# Patient Record
Sex: Female | Born: 1965 | Marital: Married | Smoking: Former smoker
Health system: Southern US, Community
[De-identification: ages and names within clinical notes are randomized; demographics above are authoritative.]

---

## 2014-02-06 DIAGNOSIS — S83249A Other tear of medial meniscus, current injury, unspecified knee, initial encounter: Secondary | ICD-10-CM | POA: Insufficient documentation

## 2014-02-06 DIAGNOSIS — Z01818 Encounter for other preprocedural examination: Secondary | ICD-10-CM | POA: Insufficient documentation

## 2014-11-12 DIAGNOSIS — H35069 Retinal vasculitis, unspecified eye: Secondary | ICD-10-CM | POA: Insufficient documentation

## 2014-11-12 DIAGNOSIS — R7982 Elevated C-reactive protein (CRP): Secondary | ICD-10-CM | POA: Insufficient documentation

## 2015-01-07 DIAGNOSIS — D869 Sarcoidosis, unspecified: Secondary | ICD-10-CM | POA: Insufficient documentation

## 2015-04-24 DIAGNOSIS — R110 Nausea: Secondary | ICD-10-CM | POA: Insufficient documentation

## 2015-04-28 DIAGNOSIS — H5203 Hypermetropia, bilateral: Secondary | ICD-10-CM | POA: Insufficient documentation

## 2015-04-28 DIAGNOSIS — E7800 Pure hypercholesterolemia, unspecified: Secondary | ICD-10-CM | POA: Insufficient documentation

## 2015-04-28 DIAGNOSIS — M1990 Unspecified osteoarthritis, unspecified site: Secondary | ICD-10-CM | POA: Insufficient documentation

## 2015-04-28 DIAGNOSIS — L929 Granulomatous disorder of the skin and subcutaneous tissue, unspecified: Secondary | ICD-10-CM | POA: Insufficient documentation

## 2015-04-28 DIAGNOSIS — H44113 Panuveitis, bilateral: Secondary | ICD-10-CM | POA: Insufficient documentation

## 2015-04-28 DIAGNOSIS — I10 Essential (primary) hypertension: Secondary | ICD-10-CM | POA: Insufficient documentation

## 2015-05-20 DIAGNOSIS — R590 Localized enlarged lymph nodes: Secondary | ICD-10-CM | POA: Insufficient documentation

## 2015-07-25 DIAGNOSIS — Z1501 Genetic susceptibility to malignant neoplasm of breast: Secondary | ICD-10-CM | POA: Insufficient documentation

## 2015-07-25 DIAGNOSIS — Z1509 Genetic susceptibility to other malignant neoplasm: Secondary | ICD-10-CM

## 2015-09-01 DIAGNOSIS — Z6841 Body Mass Index (BMI) 40.0 and over, adult: Secondary | ICD-10-CM | POA: Insufficient documentation

## 2015-09-01 DIAGNOSIS — N926 Irregular menstruation, unspecified: Secondary | ICD-10-CM | POA: Insufficient documentation

## 2015-09-11 DIAGNOSIS — H469 Unspecified optic neuritis: Secondary | ICD-10-CM | POA: Insufficient documentation

## 2015-09-11 DIAGNOSIS — H35352 Cystoid macular degeneration, left eye: Secondary | ICD-10-CM | POA: Insufficient documentation

## 2015-09-16 DIAGNOSIS — Z124 Encounter for screening for malignant neoplasm of cervix: Secondary | ICD-10-CM | POA: Insufficient documentation

## 2015-09-16 DIAGNOSIS — Z1151 Encounter for screening for human papillomavirus (HPV): Secondary | ICD-10-CM | POA: Insufficient documentation

## 2015-09-24 DIAGNOSIS — R928 Other abnormal and inconclusive findings on diagnostic imaging of breast: Secondary | ICD-10-CM | POA: Insufficient documentation

## 2015-09-24 DIAGNOSIS — D8683 Sarcoid iridocyclitis: Secondary | ICD-10-CM | POA: Insufficient documentation

## 2015-10-23 DIAGNOSIS — C50912 Malignant neoplasm of unspecified site of left female breast: Secondary | ICD-10-CM | POA: Insufficient documentation

## 2015-10-30 DIAGNOSIS — H47291 Other optic atrophy, right eye: Secondary | ICD-10-CM | POA: Insufficient documentation

## 2015-11-11 DIAGNOSIS — D862 Sarcoidosis of lung with sarcoidosis of lymph nodes: Secondary | ICD-10-CM | POA: Insufficient documentation

## 2015-11-11 DIAGNOSIS — G2581 Restless legs syndrome: Secondary | ICD-10-CM | POA: Insufficient documentation

## 2015-12-30 ENCOUNTER — Ambulatory Visit (INDEPENDENT_AMBULATORY_CARE_PROVIDER_SITE_OTHER): Payer: BLUE CROSS/BLUE SHIELD | Admitting: Podiatry

## 2015-12-30 ENCOUNTER — Encounter: Payer: Self-pay | Admitting: Podiatry

## 2015-12-30 ENCOUNTER — Encounter: Payer: Self-pay | Admitting: *Deleted

## 2015-12-30 ENCOUNTER — Ambulatory Visit (INDEPENDENT_AMBULATORY_CARE_PROVIDER_SITE_OTHER): Payer: BLUE CROSS/BLUE SHIELD

## 2015-12-30 ENCOUNTER — Other Ambulatory Visit: Payer: Self-pay | Admitting: *Deleted

## 2015-12-30 DIAGNOSIS — L603 Nail dystrophy: Secondary | ICD-10-CM | POA: Diagnosis not present

## 2015-12-30 DIAGNOSIS — B353 Tinea pedis: Secondary | ICD-10-CM | POA: Diagnosis not present

## 2015-12-30 DIAGNOSIS — M7661 Achilles tendinitis, right leg: Secondary | ICD-10-CM

## 2015-12-30 DIAGNOSIS — L600 Ingrowing nail: Secondary | ICD-10-CM

## 2015-12-30 MED ORDER — NAFTIFINE HCL 1 % EX CREA: TOPICAL_CREAM | Freq: Every day | CUTANEOUS | 3 refills | Status: AC

## 2015-12-30 NOTE — Patient Instructions (Signed)
Achilles Tendinitis With Rehab Achilles tendinitis is a disorder of the Achilles tendon. The Achilles tendon connects the large calf muscles (Gastrocnemius and Soleus) to the heel bone (calcaneus). This tendon is sometimes called the heel cord. It is important for pushing-off and standing on your toes and is important for walking, running, or jumping. Tendinitis is often caused by overuse and repetitive microtrauma. SYMPTOMS  Pain, tenderness, swelling, warmth, and redness may occur over the Achilles tendon even at rest.  Pain with pushing off, or flexing or extending the ankle.  Pain that is worsened after or during activity. CAUSES   Overuse sometimes seen with rapid increase in exercise programs or in sports requiring running and jumping.  Poor physical conditioning (strength and flexibility or endurance).  Running sports, especially training running down hills.  Inadequate warm-up before practice or play or failure to stretch before participation.  Injury to the tendon. PREVENTION   Warm up and stretch before practice or competition.  Allow time for adequate rest and recovery between practices and competition.  Keep up conditioning.  Keep up ankle and leg flexibility.  Improve or keep muscle strength and endurance.  Improve cardiovascular fitness.  Use proper technique.  Use proper equipment (shoes, skates).  To help prevent recurrence, taping, protective strapping, or an adhesive bandage may be recommended for several weeks after healing is complete. PROGNOSIS   Recovery may take weeks to several months to heal.  Longer recovery is expected if symptoms have been prolonged.  Recovery is usually quicker if the inflammation is due to a direct blow as compared with overuse or sudden strain. RELATED COMPLICATIONS   Healing time will be prolonged if the condition is not correctly treated. The injury must be given plenty of time to heal.  Symptoms can reoccur if  activity is resumed too soon.  Untreated, tendinitis may increase the risk of tendon rupture requiring additional time for recovery and possibly surgery. TREATMENT   The first treatment consists of rest anti-inflammatory medication, and ice to relieve the pain.  Stretching and strengthening exercises after resolution of pain will likely help reduce the risk of recurrence. Referral to a physical therapist or athletic trainer for further evaluation and treatment may be helpful.  A walking boot or cast may be recommended to rest the Achilles tendon. This can help break the cycle of inflammation and microtrauma.  Arch supports (orthotics) may be prescribed or recommended by your caregiver as an adjunct to therapy and rest.  Surgery to remove the inflamed tendon lining or degenerated tendon tissue is rarely necessary and has shown less than predictable results. MEDICATION   Nonsteroidal anti-inflammatory medications, such as aspirin and ibuprofen, may be used for pain and inflammation relief. Do not take within 7 days before surgery. Take these as directed by your caregiver. Contact your caregiver immediately if any bleeding, stomach upset, or signs of allergic reaction occur. Other minor pain relievers, such as acetaminophen, may also be used.  Pain relievers may be prescribed as necessary by your caregiver. Do not take prescription pain medication for longer than 4 to 7 days. Use only as directed and only as much as you need.  Cortisone injections are rarely indicated. Cortisone injections may weaken tendons and predispose to rupture. It is better to give the condition more time to heal than to use them. HEAT AND COLD  Cold is used to relieve pain and reduce inflammation for acute and chronic Achilles tendinitis. Cold should be applied for 10 to 15 minutes every   2 to 3 hours for inflammation and pain and immediately after any activity that aggravates your symptoms. Use ice packs or an ice  massage.  Heat may be used before performing stretching and strengthening activities prescribed by your caregiver. Use a heat pack or a warm soak. SEEK MEDICAL CARE IF:  Symptoms get worse or do not improve in 2 weeks despite treatment.  New, unexplained symptoms develop. Drugs used in treatment may produce side effects. EXERCISES RANGE OF MOTION (ROM) AND STRETCHING EXERCISES - Achilles Tendinitis  These exercises may help you when beginning to rehabilitate your injury. Your symptoms may resolve with or without further involvement from your physician, physical therapist or athletic trainer. While completing these exercises, remember:   Restoring tissue flexibility helps normal motion to return to the joints. This allows healthier, less painful movement and activity.  An effective stretch should be held for at least 30 seconds.  A stretch should never be painful. You should only feel a gentle lengthening or release in the stretched tissue. STRETCH - Gastroc, Standing   Place hands on wall.  Extend right / left leg, keeping the front knee somewhat bent.  Slightly point your toes inward on your back foot.  Keeping your right / left heel on the floor and your knee straight, shift your weight toward the wall, not allowing your back to arch.  You should feel a gentle stretch in the right / left calf. Hold this position for __________ seconds. Repeat __________ times. Complete this stretch __________ times per day. STRETCH - Soleus, Standing   Place hands on wall.  Extend right / left leg, keeping the other knee somewhat bent.  Slightly point your toes inward on your back foot.  Keep your right / left heel on the floor, bend your back knee, and slightly shift your weight over the back leg so that you feel a gentle stretch deep in your back calf.  Hold this position for __________ seconds. Repeat __________ times. Complete this stretch __________ times per day. STRETCH -  Gastrocsoleus, Standing  Note: This exercise can place a lot of stress on your foot and ankle. Please complete this exercise only if specifically instructed by your caregiver.   Place the ball of your right / left foot on a step, keeping your other foot firmly on the same step.  Hold on to the wall or a rail for balance.  Slowly lift your other foot, allowing your body weight to press your heel down over the edge of the step.  You should feel a stretch in your right / left calf.  Hold this position for __________ seconds.  Repeat this exercise with a slight bend in your knee. Repeat __________ times. Complete this stretch __________ times per day.  STRENGTHENING EXERCISES - Achilles Tendinitis These exercises may help you when beginning to rehabilitate your injury. They may resolve your symptoms with or without further involvement from your physician, physical therapist or athletic trainer. While completing these exercises, remember:   Muscles can gain both the endurance and the strength needed for everyday activities through controlled exercises.  Complete these exercises as instructed by your physician, physical therapist or athletic trainer. Progress the resistance and repetitions only as guided.  You may experience muscle soreness or fatigue, but the pain or discomfort you are trying to eliminate should never worsen during these exercises. If this pain does worsen, stop and make certain you are following the directions exactly. If the pain is still present after adjustments,   discontinue the exercise until you can discuss the trouble with your clinician. STRENGTH - Plantar-flexors   Sit with your right / left leg extended. Holding onto both ends of a rubber exercise band/tubing, loop it around the ball of your foot. Keep a slight tension in the band.  Slowly push your toes away from you, pointing them downward.  Hold this position for __________ seconds. Return slowly, controlling the  tension in the band/tubing. Repeat __________ times. Complete this exercise __________ times per day.  STRENGTH - Plantar-flexors   Stand with your feet shoulder width apart. Steady yourself with a wall or table using as little support as needed.  Keeping your weight evenly spread over the width of your feet, rise up on your toes.*  Hold this position for __________ seconds. Repeat __________ times. Complete this exercise __________ times per day.  *If this is too easy, shift your weight toward your right / left leg until you feel challenged. Ultimately, you may be asked to do this exercise with your right / left foot only. STRENGTH - Plantar-flexors, Eccentric  Note: This exercise can place a lot of stress on your foot and ankle. Please complete this exercise only if specifically instructed by your caregiver.   Place the balls of your feet on a step. With your hands, use only enough support from a wall or rail to keep your balance.  Keep your knees straight and rise up on your toes.  Slowly shift your weight entirely to your right / left toes and pick up your opposite foot. Gently and with controlled movement, lower your weight through your right / left foot so that your heel drops below the level of the step. You will feel a slight stretch in the back of your calf at the end position.  Use the healthy leg to help rise up onto the balls of both feet, then lower weight only on the right / left leg again. Build up to 15 repetitions. Then progress to 3 consecutive sets of 15 repetitions.*  After completing the above exercise, complete the same exercise with a slight knee bend (about 30 degrees). Again, build up to 15 repetitions. Then progress to 3 consecutive sets of 15 repetitions.* Perform this exercise __________ times per day.  *When you easily complete 3 sets of 15, your physician, physical therapist or athletic trainer may advise you to add resistance by wearing a backpack filled with  additional weight. STRENGTH - Plantar Flexors, Seated   Sit on a chair that allows your feet to rest flat on the ground. If necessary, sit at the edge of the chair.  Keeping your toes firmly on the ground, lift your right / left heel as far as you can without increasing any discomfort in your ankle. Repeat __________ times. Complete this exercise __________ times a day. *If instructed by your physician, physical therapist or athletic trainer, you may add ____________________ of resistance by placing a weighted object on your right / left knee.   This information is not intended to replace advice given to you by your health care provider. Make sure you discuss any questions you have with your health care provider.   Document Released: 09/23/2004 Document Revised: 03/15/2014 Document Reviewed: 06/06/2008 Elsevier Interactive Patient Education 2016 [employer] Betadine Soak Instructions  Purchase an 8 oz. bottle of BETADINE solution (Povidone)  THE DAY AFTER THE PROCEDURE  Place 1 tablespoon of betadine solution in a quart of warm tap water.  Submerge your foot or feet  with outer bandage intact for the initial soak; this will allow the bandage to become moist and wet for easy lift off.  Once you remove your bandage, continue to soak in the solution for 20 minutes.  This soak should be done twice a day.  Next, remove your foot or feet from solution, blot dry the affected area and cover.  You may use a band aid large enough to cover the area or use gauze and tape.  Apply other medications to the area as directed by the doctor such as cortisporin otic solution (ear drops) or neosporin.  IF YOUR SKIN BECOMES IRRITATED WHILE USING THESE INSTRUCTIONS, IT IS OKAY TO SWITCH TO EPSOM SALTS AND WATER OR WHITE VINEGAR AND WATER.

## 2015-12-30 NOTE — Progress Notes (Signed)
   Subjective:    Patient ID: Colleen Clayton, female    DOB: Aug 17, 1965, 50 y.o.   MRN: [id number]  HPI: She presents today with a chief complaint of pain to the posterior heel right. She states it is been burning and aching for the past few months and seems to be getting worse. She states the morning pain is consistently worse. It also hurts just to rub on the bed sheets. She has rash and plantar aspect of the bilateral foot inserts that this may be part of her sarcoidosis. She is also discerned about the discoloration of the hallux nail plate left and a very sharp incurvated nail margin to the fibular border of the hallux right. Stage I breast cancer diagnosed August of this year. She is scheduled for a bilateral mastectomy at the end of November.    Review of Systems  Constitutional: Positive for unexpected weight change.  Eyes: Positive for visual disturbance.  Respiratory: Positive for wheezing.   Cardiovascular: Positive for leg swelling.  Endocrine: Positive for heat intolerance.  Musculoskeletal: Positive for arthralgias and gait problem.  Skin: Positive for rash.  Allergic/Immunologic: Positive for environmental allergies.  Neurological: Positive for headaches.  Hematological: Bruises/bleeds easily.  All other systems reviewed and are negative.      Objective:   Physical Exam: Vital signs are stable she is alert and oriented 3. Pulses are strongly palpable. Neurologic sensorium is intact. Deep tendon reflexes intact bilateral muscle strength is normal bilateral. She has pain on palpation to the posterior aspect of the Achilles at its insertion sinus. Aspect of the calcaneus. 3 views of the radiographs of the right foot taken today in the office demonstrated retrocalcaneal spur with soft tissue increase in density of the Achilles at the superior aspect of the calcaneus. No rupture is identified and no avulsion or fractures noticeably calcaneus. She also has a dry macular rash to the  plantar aspect in a moccasin distribution bilaterally. Discoloration of the hallux nail plate left with subungual debris and thickening is indicative of nail dystrophy artery very least onychomycosis. She also had a sharp incurvated nail margin to the fibular border of the hallux right with erythema and edema along the fibular border. This is consistent with paronychia.        Assessment & Plan:  Stage I breast cancer scheduled for bilateral mastectomy. Ingrown nail paronychia fibular border hallux right. Nail dystrophy hallux left Insertional Achilles tendinitis right. Tinea pedis.  Plan: Started her on Naftin 1% cream. Injected around the tendon 2 mg dexamethasone right heel. Placed her in a [place]. Provided ice and stretching instructions. Performed a incision and drainage along the fibular border of the hallux right after local anesthesia was administered. She tolerated procedure well. She is provided with both Augmentin with instructions for care and soaking of her toe. I also took samples of the toenail today to be sent for pathologic evaluation left foot. I will follow up with her in a couple weeks.  Bilateral mastectomy and of November 2017.

## 2016-01-08 ENCOUNTER — Telehealth: Payer: Self-pay | Admitting: *Deleted

## 2016-01-08 NOTE — Telephone Encounter (Addendum)
Pt request handicap sticker. Dr. Milinda Pointer ordered 6 month handicap sticker.06/28/2016-Pt request renewal of handicap sticker. I spoke with pt and encouraged her to make an appt if she was continuing to have problem with the foot we needed to see her. Pt states she is being treated for breast cancer and has been focusing on that. I told her I could renew for 3 months, but not to let the foot problem worsen. Pt states understanding.

## 2016-01-13 ENCOUNTER — Ambulatory Visit (INDEPENDENT_AMBULATORY_CARE_PROVIDER_SITE_OTHER): Payer: BLUE CROSS/BLUE SHIELD | Admitting: Podiatry

## 2016-01-13 ENCOUNTER — Encounter: Payer: Self-pay | Admitting: Podiatry

## 2016-01-13 DIAGNOSIS — M7661 Achilles tendinitis, right leg: Secondary | ICD-10-CM

## 2016-01-13 DIAGNOSIS — L600 Ingrowing nail: Secondary | ICD-10-CM

## 2016-01-13 NOTE — Patient Instructions (Signed)

## 2016-01-13 NOTE — Progress Notes (Signed)
She presents today for follow-up of her matrixectomy fibular border hallux right. She also presents today for follow-up of her Achilles tendinitis right foot. We also took samples of her nail last time she was in December for pathologic evaluation which has not returned yet. She states that she continues to soaking the toe is doing very well. She is happy with the outcome of the rash at this point since she has been utilizing medication. She relates that the Achilles tendinitis is only 5-10% improved without the Cam Walker.  Objective: Vital signs are stable alert and oriented 3. Pulses are palpable. She has pain on palpation of the Achilles tendon of the right heel. Near resolution of the athlete's foot. She also has a well-healing surgical toe fibular border hallux right.  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 Well-healing matrixectomy fibular border hallux right. Tinea pedis bilateral resolving.  Plan: We will await the final biopsy report for the toenails. She will continue the use of the Cam Walker. Continue to soak the toe Epsom salts and warm water 1 week. Continue use of the athlete's foot cream I will follow-up with her in 2 weeks at which time we will more than likely request an MRI if she is not progressing with the Achilles tendon. **She will be undergoing a double mastectomy after Thanksgiving.

## 2016-01-27 ENCOUNTER — Encounter (INDEPENDENT_AMBULATORY_CARE_PROVIDER_SITE_OTHER): Payer: BLUE CROSS/BLUE SHIELD | Admitting: Podiatry

## 2016-01-27 NOTE — Progress Notes (Signed)
This encounter was created in error - please disregard.

## 2016-03-03 ENCOUNTER — Telehealth: Payer: Self-pay | Admitting: Podiatry

## 2016-03-03 NOTE — Telephone Encounter (Signed)
Informed pt that Dr. Milinda Pointer had reviewed the fungal culture results as + and she would need to be treated, and reiterated the date and day of her next scheduled appt and pt states the appt day is not as she would like. Transferred to schedulers.

## 2016-03-03 NOTE — Telephone Encounter (Signed)
Patient is requesting her lab results. She missed her appointment to go over them on 21 November. She is scheduled to come in on Tuesday 18 March 2016 at 9:45 am.

## 2016-03-16 ENCOUNTER — Ambulatory Visit: Payer: BLUE CROSS/BLUE SHIELD | Admitting: Podiatry

## 2016-03-23 ENCOUNTER — Ambulatory Visit: Payer: BLUE CROSS/BLUE SHIELD | Admitting: Podiatry

## 2016-03-25 ENCOUNTER — Ambulatory Visit: Payer: BLUE CROSS/BLUE SHIELD | Admitting: Podiatry

## 2016-03-29 ENCOUNTER — Encounter: Payer: Self-pay | Admitting: Podiatry

## 2016-03-29 ENCOUNTER — Ambulatory Visit (INDEPENDENT_AMBULATORY_CARE_PROVIDER_SITE_OTHER): Payer: BLUE CROSS/BLUE SHIELD | Admitting: Podiatry

## 2016-03-29 VITALS — BP 155/101 | HR 130 | Resp 16

## 2016-03-29 DIAGNOSIS — B351 Tinea unguium: Secondary | ICD-10-CM

## 2016-03-29 DIAGNOSIS — M7661 Achilles tendinitis, right leg: Secondary | ICD-10-CM

## 2016-03-29 MED ORDER — TERBINAFINE HCL 250 MG PO TABS: 250.0000 mg | ORAL_TABLET | Freq: Every day | ORAL | 0 refills | Status: DC

## 2016-03-30 NOTE — Progress Notes (Addendum)
Subjective:     Patient ID: Colleen Clayton, female   DOB: [birth year], 51 y.o.   MRN: [id number]  HPI patient presents with husband stating her right heel has been hurting her a lot and she no she needs to get something done with this but she did have a mastectomy November   Review of Systems     Objective:   Physical Exam Neurovascular status intact muscle strength adequate with discomfort in the posterior aspect right heel medial and central side with mild on the lateral it's very tender when pressed and makes it hard to walk comfortably. Patient is found to have indications of spurring and also has mild nail disease noted    Assessment:     Chronic Achilles tendinitis right with spur formation and pain that's failed to respond him mobilization heat ice therapy and previous injection treatment along with mycotic nail infection    Plan:     H&P both conditions discussed and we want to try to avoid surgery and right due to previous surgery and I have recommended her for shockwave therapy which she will have accomplished on the medial and central element of the plantar posterior Achilles tendon. I did place her on a short-term of Lamisil to try to help with her nail disease and she will have this checked against the medication she is taking currently by her physician who is treating her currently  X-ray report indicates large posterior spur right

## 2016-06-15 DIAGNOSIS — E1165 Type 2 diabetes mellitus with hyperglycemia: Secondary | ICD-10-CM | POA: Insufficient documentation

## 2016-10-26 ENCOUNTER — Ambulatory Visit (INDEPENDENT_AMBULATORY_CARE_PROVIDER_SITE_OTHER): Payer: BLUE CROSS/BLUE SHIELD | Admitting: Podiatry

## 2016-10-26 DIAGNOSIS — M7661 Achilles tendinitis, right leg: Secondary | ICD-10-CM | POA: Diagnosis not present

## 2016-10-26 NOTE — Progress Notes (Signed)
Subjective: Colleen Clayton presents the office today for concerns of recurrent pain to the back of the right heel. She states that she was having injection as well as tried changing shoes without any significant improvement. At her last appointment Dr. Paulla Dolly had discussed with her EPAT but she did not undergo this. She states that she was doing better after last appointment but the symptoms have returned. She denies any recent injury or trauma. No numbness or tingling. Denies any systemic complaints such as fevers, chills, nausea, vomiting. No acute changes since last appointment, and no other complaints at this time.   Objective: AAO x3, NAD DP/PT pulses palpable bilaterally, CRT less than 3 seconds There is tenderness the posterior aspect of the right heel along the insertion of the Achilles tendon into the calcaneus and heel spurs present. There is no overlying edema, erythema, increase in warmth. Achilles tendon appears to be intact. Thompson test is negative. There is no pain along the course the plantar fascia. No pain with lateral compression of the calcaneus. There is no other areas of tenderness identified at this time. Equinus is present.  No open lesions or pre-ulcerative lesions.  No pain with calf compression, swelling, warmth, erythema  Assessment: Insertional Achilles tendinitis with heel spur right side  Plan: -All treatment options discussed with the patient including all alternatives, risks, complications.  -At this point discussed with her physical therapy. She will go ahead and proceed with this and the prescription was provided to her for [facility]. Continue home stretching, icing at home as well. Discussed with her shoe modifications and orthotics. Offloading sleeve was dispensed.  -Follow-up in 6 weeks or sooner if needed. -Patient encouraged to call the office with any questions, concerns, change in symptoms.   Celesta Gentile, DPM

## 2016-10-26 NOTE — Patient Instructions (Signed)

## 2016-11-04 ENCOUNTER — Ambulatory Visit: Payer: BLUE CROSS/BLUE SHIELD | Admitting: Podiatry

## 2016-12-23 ENCOUNTER — Ambulatory Visit: Payer: BLUE CROSS/BLUE SHIELD | Admitting: Podiatry

## 2017-01-01 DIAGNOSIS — D696 Thrombocytopenia, unspecified: Secondary | ICD-10-CM | POA: Insufficient documentation

## 2017-02-24 ENCOUNTER — Telehealth: Payer: Self-pay | Admitting: *Deleted

## 2017-02-24 NOTE — Telephone Encounter (Signed)
Pt request handicap sticker, hers will expire prior to next appt. Left message informing pt Dr. Jacqualyn Posey would not be back in the [facility] office for 2 weeks, but she could pick up the handicap sticker in the [place] office or in [place] at the next 2 weeks.

## 2017-03-10 ENCOUNTER — Telehealth: Payer: Self-pay | Admitting: Podiatry

## 2017-03-10 NOTE — Telephone Encounter (Signed)
Pt would like to speak with you about Therapy, instead of coming in.

## 2017-03-10 NOTE — Telephone Encounter (Signed)
I called the patient to discuss treatment options. She is hesitant on PT. We discussed EPAT and would likely do this. She is going to call the office to get this scheduled. She had no further questions or concerns.

## 2017-03-15 ENCOUNTER — Ambulatory Visit: Payer: BLUE CROSS/BLUE SHIELD | Admitting: Podiatry

## 2017-05-17 ENCOUNTER — Encounter: Payer: Self-pay | Admitting: Podiatry

## 2017-05-17 ENCOUNTER — Ambulatory Visit (INDEPENDENT_AMBULATORY_CARE_PROVIDER_SITE_OTHER): Payer: PRIVATE HEALTH INSURANCE | Admitting: Podiatry

## 2017-05-17 VITALS — BP 156/99 | HR 105 | Resp 16

## 2017-05-17 DIAGNOSIS — L600 Ingrowing nail: Secondary | ICD-10-CM | POA: Diagnosis not present

## 2017-05-17 MED ORDER — CIPROFLOXACIN-HYDROCORTISONE 0.2-1 % OT SUSP: OTIC | 2 refills | Status: DC

## 2017-05-17 NOTE — Patient Instructions (Signed)

## 2017-05-17 NOTE — Progress Notes (Signed)
She presents today with a chief complaint of a painful ingrown toenail to the tibial border of the right hallux.  She states is been hurting for the past month.  Objective: Vital signs are stable she is alert and oriented x3 pulses are palpable.  Neurologic sensorium is intact.  Deep tendon reflexes are intact.  Muscle strength normal symmetrical bilateral.  Cutaneous evaluation demonstrates no cutis breakdown though she does have sharp incurvated nail margin along the tibial and fibular border of the hallux right.  This is exquisitely tender on palpation.  Assessment: Pain in limb secondary to ingrown toenails to avoid inferior wall of the hallux right.  Plan: After verbal consent local anesthetic was injected about the hallux right after sterile Betadine skin prep.  A total of 3 cc of a 50-50 mixture of Marcaine plain and lidocaine plain were injected.  She tolerated this well.  After that a chemical matrixectomy was performed along the tibial and fibular border.  She tolerated this procedure well.  Dry sterile compressive dressing was applied with Silvadene cream.  She was given both oral and written home-going instructions for the care and soaking of her toe as well as a prescription for Cipro drops.  I will follow-up with her in 1-2 weeks at which time her husband will also make an appointment.

## 2017-05-18 ENCOUNTER — Telehealth: Payer: Self-pay | Admitting: Podiatry

## 2017-05-18 NOTE — Telephone Encounter (Signed)
I saw Dr. Milinda Pointer yesterday and the antibiotic he prescribed is requiring prior authorization. I was calling to follow up on that and to see if it had been done. I really need this medication. Please call me back at [phone]. Thank you.

## 2017-05-19 ENCOUNTER — Telehealth: Payer: Self-pay | Admitting: *Deleted

## 2017-05-19 NOTE — Telephone Encounter (Signed)
Pt states her ingrown toenail procedure is still bleeding when she walks and she would like to pick up a stiff bottom shoe from the [facility] location.

## 2017-05-19 NOTE — Telephone Encounter (Signed)
I spoke with pt, she states medication would be over $500.00 even with insurance coverage. I told pt she could use neosporin if not allergic. Pt states she is allergic to Neomycin, so I told her to get Bacitracin. Pt states understanding.

## 2017-05-19 NOTE — Telephone Encounter (Signed)
I told pt that she would be able to pick up a surgical shoe in the [facility] location tomorrow before 4:00pm. Pt states understanding.

## 2017-05-20 ENCOUNTER — Encounter: Payer: Self-pay | Admitting: *Deleted

## 2017-05-20 NOTE — Telephone Encounter (Addendum)
Pt present to office for darco shoe to right foot, to decrease press on toenail procedure. Pt request note for work absence from 05/17/2017 to 05/24/2017. Pt was given the letter in office.

## 2017-05-25 ENCOUNTER — Telehealth: Payer: Self-pay | Admitting: *Deleted

## 2017-05-25 NOTE — Telephone Encounter (Signed)
Pt states the toe is red, swollen with a drainage. I told pt the area would be red, swollen, oozy and painful on and off for 4-6 weeks to varying degrees, and this the normal, but if the redness, swelling, pain increased or there was a cloudy drainage she may need to be seen or an antibiotic. Pt states it is red, swollen and has a cloudy drainage. I told pt to continue the soaks and the bacitracin ointment and I would inform Dr. Milinda Pointer. Pt states she still needs the PA for the other medication and I told her if she was using the bacitracin she did not need the drops. Pt states she did not remember being told that. I told her that was because she said she could not afford the drops even with insurance coverage. I told pt I had not received the PA request and reviewed the clinicals and pt had previously been seen at the [facility] center. I told pt to call her pharmacy with my fax [phone] and I would take care of the PA.

## 2017-05-25 NOTE — Telephone Encounter (Signed)
Pt states she is still having a problem with the toenail and would like an antibiotic and pharmacy states documentation needs to be sent to the pharmacy.

## 2017-05-26 MED ORDER — DOXYCYCLINE HYCLATE 100 MG PO CAPS: 100.0000 mg | ORAL_CAPSULE | Freq: Two times a day (BID) | ORAL | 0 refills | Status: DC

## 2017-05-26 NOTE — Telephone Encounter (Signed)
I informed pt of Dr. Stephenie Acres orders and she states she is in [place] and will call when she returns. I told pt the antibiotic was called to the [facility] in [facility] and she said that was fine.

## 2017-05-26 NOTE — Telephone Encounter (Signed)
Start her on doxycycline and I will check her next week.

## 2017-06-25 DIAGNOSIS — J181 Lobar pneumonia, unspecified organism: Secondary | ICD-10-CM

## 2017-06-25 DIAGNOSIS — J189 Pneumonia, unspecified organism: Secondary | ICD-10-CM | POA: Insufficient documentation

## 2017-06-25 DIAGNOSIS — E119 Type 2 diabetes mellitus without complications: Secondary | ICD-10-CM | POA: Insufficient documentation

## 2017-06-25 DIAGNOSIS — I10 Essential (primary) hypertension: Secondary | ICD-10-CM | POA: Insufficient documentation

## 2017-12-22 ENCOUNTER — Ambulatory Visit (INDEPENDENT_AMBULATORY_CARE_PROVIDER_SITE_OTHER): Payer: BLUE CROSS/BLUE SHIELD | Admitting: Podiatry

## 2017-12-22 ENCOUNTER — Encounter: Payer: Self-pay | Admitting: Podiatry

## 2017-12-22 DIAGNOSIS — L603 Nail dystrophy: Secondary | ICD-10-CM | POA: Diagnosis not present

## 2017-12-22 NOTE — Progress Notes (Signed)
She presents today states that her toenail is dark and she would like to treat it for fungus.  Objective: Vital signs are stable she is alert and oriented x3 toenails hallux are thick yellow dystrophic distal Onikul lysis and crumbly.  Assessment: Probable nail dystrophy cannot rule out onychomycosis.  Plan: Samples of the skin and nail taken today and sent for pathologic evaluation.

## 2018-01-06 ENCOUNTER — Encounter: Payer: Self-pay | Admitting: Podiatry

## 2018-01-19 ENCOUNTER — Ambulatory Visit: Payer: PRIVATE HEALTH INSURANCE | Admitting: Podiatry

## 2018-03-07 ENCOUNTER — Encounter: Payer: Self-pay | Admitting: Podiatry

## 2018-03-07 ENCOUNTER — Ambulatory Visit (INDEPENDENT_AMBULATORY_CARE_PROVIDER_SITE_OTHER): Payer: BLUE CROSS/BLUE SHIELD

## 2018-03-07 ENCOUNTER — Ambulatory Visit (INDEPENDENT_AMBULATORY_CARE_PROVIDER_SITE_OTHER): Payer: BLUE CROSS/BLUE SHIELD | Admitting: Podiatry

## 2018-03-07 ENCOUNTER — Other Ambulatory Visit: Payer: Self-pay | Admitting: Podiatry

## 2018-03-07 DIAGNOSIS — M7750 Other enthesopathy of unspecified foot: Secondary | ICD-10-CM

## 2018-03-07 DIAGNOSIS — M25572 Pain in left ankle and joints of left foot: Secondary | ICD-10-CM

## 2018-03-07 DIAGNOSIS — M722 Plantar fascial fibromatosis: Secondary | ICD-10-CM

## 2018-03-07 DIAGNOSIS — L603 Nail dystrophy: Secondary | ICD-10-CM

## 2018-03-07 DIAGNOSIS — M7752 Other enthesopathy of left foot: Secondary | ICD-10-CM

## 2018-03-07 MED ORDER — TERBINAFINE HCL 250 MG PO TABS: 250.0000 mg | ORAL_TABLET | Freq: Every day | ORAL | 0 refills | Status: AC

## 2018-03-07 NOTE — Progress Notes (Signed)
She presents today with her husband and son with a chief complaint of pain to the lateral aspect of the left foot and sometimes right up and here she points to the fourth fifth tarsometatarsal joint area.  States is been going on now for about 2 weeks.  He is also concerned about pain around the left heel.  She is also type II diabetic and would like to know the results of her pathology.  Objective: Vital signs are stable she is alert and oriented x3.  Pulses are palpable.  She has pain on palpation medial calcaneal tubercle of the left heel.  She has pain on palpation of the sinus tarsi and the fourth fifth tarsometatarsal joints.  Radiographs taken today do not demonstrate any type of acute osseous abnormalities other than soft tissue swelling at the plantar fascial calcaneal insertion site.  Pathology from her toenails does demonstrate onychomycosis.  Dermatophytes.  Assessment: Dermatophytes to toenails.  Pain in limb such as plantar fasciitis with lateral compensatory syndrome left foot.  Plan: At this point I injected her sinus tarsi of her left foot with Kenalog I also injected the plantar fascial both with 20 mg Kenalog 5 mg Marcaine under sterile Betadine skin prep.  Placed in plantar fascial brace instructed her upon shoe gear also started her on a Lamisil therapy 250 mg tablets #31 p.o. daily and I will follow-up with her in 1 month for evaluation of both of these.  She is supposed to have blood work done next week from her PCP I would like to follow-up with that.

## 2018-04-19 ENCOUNTER — Telehealth: Payer: Self-pay | Admitting: Podiatry

## 2018-04-19 NOTE — Telephone Encounter (Signed)
Spoke to pt and made her aware there is no guarantee of payment per insurance company but she has met her deductible and out of pocket so they should be covered @ 100%.

## 2018-04-19 NOTE — Telephone Encounter (Signed)
Left message for pt to call to discuss orthotic coverage. She has an appt with Liliane Channel 2.13.2020

## 2018-04-20 ENCOUNTER — Ambulatory Visit: Payer: PRIVATE HEALTH INSURANCE | Admitting: Podiatry

## 2018-04-20 ENCOUNTER — Ambulatory Visit (INDEPENDENT_AMBULATORY_CARE_PROVIDER_SITE_OTHER): Payer: PRIVATE HEALTH INSURANCE | Admitting: Orthotics

## 2018-04-20 DIAGNOSIS — M7752 Other enthesopathy of left foot: Secondary | ICD-10-CM

## 2018-04-20 DIAGNOSIS — M7751 Other enthesopathy of right foot: Secondary | ICD-10-CM

## 2018-04-20 DIAGNOSIS — M7661 Achilles tendinitis, right leg: Secondary | ICD-10-CM

## 2018-04-20 DIAGNOSIS — M722 Plantar fascial fibromatosis: Secondary | ICD-10-CM

## 2018-04-20 NOTE — Progress Notes (Signed)
Patient presents today with a hx of PTTD/AAF.  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  Patient has medially shifted talus/navicular.  Goal is provide longitudinal arch support and RF stability.  Plan on deep heel cup, hug arch, wide foot orthosis w/ medial flange and varus correction for RF valgus deformity.  Patient educated in the progessive nature of PTTD and financial responsibility.  

## 2018-05-18 ENCOUNTER — Other Ambulatory Visit: Payer: PRIVATE HEALTH INSURANCE | Admitting: Orthotics

## 2018-08-23 ENCOUNTER — Telehealth: Payer: Self-pay | Admitting: Podiatry

## 2018-08-23 NOTE — Telephone Encounter (Signed)
Pt left message @ 926 asking about picking up her orthotics, She was not able to because of pandemic.   Returned call and left message for pt to call me back to schedule an appt to pick them up.

## 2018-08-23 NOTE — Telephone Encounter (Signed)
Pt returned call and is scheduled for 6.23.2020. to see Liliane Channel to puo.

## 2018-08-29 ENCOUNTER — Encounter: Payer: Self-pay | Admitting: Orthotics

## 2018-08-31 ENCOUNTER — Ambulatory Visit: Payer: Self-pay | Admitting: Orthotics

## 2018-08-31 ENCOUNTER — Other Ambulatory Visit: Payer: Self-pay

## 2018-08-31 DIAGNOSIS — M722 Plantar fascial fibromatosis: Secondary | ICD-10-CM

## 2018-08-31 DIAGNOSIS — M7661 Achilles tendinitis, right leg: Secondary | ICD-10-CM

## 2018-08-31 DIAGNOSIS — M7752 Other enthesopathy of left foot: Secondary | ICD-10-CM

## 2018-08-31 NOTE — Progress Notes (Signed)
Patient came in today to p/up functional foot orthotics.   The orthotics were assessed to both fit and function.  The F/O addressed the biomechanical issues/pathologies as intended, offering good longitudinal arch support, proper offloading, and foot support. There weren't any signs of discomfort or irritation.  The F/O fit properly in footwear with minimal trimming/adjustments. 

## 2019-02-15 ENCOUNTER — Ambulatory Visit (INDEPENDENT_AMBULATORY_CARE_PROVIDER_SITE_OTHER): Payer: BLUE CROSS/BLUE SHIELD | Admitting: Podiatry

## 2019-02-15 ENCOUNTER — Telehealth: Payer: Self-pay | Admitting: *Deleted

## 2019-02-15 ENCOUNTER — Encounter: Payer: Self-pay | Admitting: Podiatry

## 2019-02-15 ENCOUNTER — Other Ambulatory Visit: Payer: Self-pay

## 2019-02-15 DIAGNOSIS — M722 Plantar fascial fibromatosis: Secondary | ICD-10-CM

## 2019-02-15 DIAGNOSIS — L603 Nail dystrophy: Secondary | ICD-10-CM | POA: Diagnosis not present

## 2019-02-15 DIAGNOSIS — M66872 Spontaneous rupture of other tendons, left ankle and foot: Secondary | ICD-10-CM

## 2019-02-15 DIAGNOSIS — M6688 Spontaneous rupture of other tendons, other: Secondary | ICD-10-CM | POA: Diagnosis not present

## 2019-02-15 DIAGNOSIS — L600 Ingrowing nail: Secondary | ICD-10-CM | POA: Diagnosis not present

## 2019-02-15 DIAGNOSIS — M7752 Other enthesopathy of left foot: Secondary | ICD-10-CM

## 2019-02-15 DIAGNOSIS — M66869 Spontaneous rupture of other tendons, unspecified lower leg: Secondary | ICD-10-CM

## 2019-02-15 MED ORDER — CIPROFLOXACIN-DEXAMETHASONE 0.3-0.1 % OT SUSP: OTIC | 2 refills | Status: AC

## 2019-02-15 NOTE — Telephone Encounter (Signed)
Orders to given to A. Prevette, CMA for pre-cert.

## 2019-02-15 NOTE — Patient Instructions (Signed)

## 2019-02-15 NOTE — Progress Notes (Signed)
She presents today after having not seen her for a year with a chief concern of pain to the ankle left.  States that it still hurts I was unable to wear those orthotics because they were just too hard and made my my feet hurt but my ankle is just killing me is really painful right here she points to the anterior medial aspect of the ankle and tibialis anterior as well as the posterior tibial tendon.  They are exquisitely tender when she moves and she says.  She also has pain in the great toe along the tibial and fibular border of the left hallux with discoloration of the toenail.  She is wondering what was wrong with this.  The contralateral foot right, demonstrates pain on ambulation along the lateral aspect but also to the medial aspect of the heel.  Denies any trauma.  ROS: Denies fever chills nausea vomiting muscle aches pains calf pain back pain chest pain shortness of breath.  Objective: Vital signs are stable alert and oriented x3.  Pulses are palpable.  She has pain on palpation of the posterior tibial tendon with inversion against resistance been exquisitely painful.  She has pain on dorsiflexion and inversion with a small palpable mass on the medial aspect of the tibialis anterior at the level of the ankle joint.  Measures this about the size of a centimeter.  She also has pain on palpation to the lateral calcaneal tubercle of the right heel.  Left great toe does demonstrate sharp incurvated nail margins with erythema and edema along the tibiofibular border of the left hallux.  There is no purulence no malodor.  Assessment: Chronic intractable left ankle pain with possible tears of the posterior tibial tendon and the tibialis anterior cannot rule out a cyst of the tibialis anterior and cannot rule out chronic capsulitis of the ankle joint.  I feel that an MRI would best benefit this.  Ingrown toenails tibiofibular border the hallux left.  Plan fasciitis of the right heel.  Plan: Discussed  etiology pathology conservative or surgical therapies.  Injected the right lateral heel today with 20 mg of Kenalog 5 mg Marcaine point of maximal tenderness.  Tolerated procedure well without complications.  Chemical matrixectomy was performed along the tibiofibular border of the hallux left today.  Tolerated procedure well without complications.  She was given both oral and written home-going structure for the care and soaking of the tenderness.  She also received a prescription for Cipro dexamethasone drops to be applied to the toenail borders.  Samples of the same toenail was taken for pathologic evaluation and we will follow-up with her once that comes in.  I also recommended that she follow-up with Liliane Channel to see about redoing her orthotics.  She had them a year but she is never worn them.  So she will follow-up with him for that.  Also we will request an MRI of the left ankle for better visualization of the tibialis and posterior tibial tendons as well as the ankle joint.  This is for surgical consideration.  We are also going to put her in a Tri-Lock brace for stability at this point and set of any injections.

## 2019-02-15 NOTE — Telephone Encounter (Signed)
-----   Message from Rip Harbour, [facility] sent at 02/15/2019  3:54 PM EST ----- Regarding: MRI MRI left ankle - evaluate posterior tibial tendon tear and tibialis anterior tendon tear left - surgical consideration   She would like this done in [place] [phone]

## 2019-02-21 ENCOUNTER — Telehealth: Payer: Self-pay | Admitting: Podiatry

## 2019-02-21 NOTE — Telephone Encounter (Signed)
Pt called to say that med that was called in for pt with insurance the med is 150.00 wanted to know if there was a different med she can get. Was seen 02/15/2019 for ingrown

## 2019-02-21 NOTE — Telephone Encounter (Signed)
I informed pt, if she could not purchase the antibiotic drops, she could use neosporin on the bandaid after the soakings.

## 2019-02-21 NOTE — Telephone Encounter (Signed)
[employer] authorization for MRI done  Approved - Confirmation [id number]  02/21/2019 - 08/2019

## 2019-02-21 NOTE — Telephone Encounter (Signed)
Faxed orders and pre-cert information from A. Prevette, CMA to [place].

## 2019-03-13 ENCOUNTER — Ambulatory Visit: Payer: BLUE CROSS/BLUE SHIELD | Admitting: Podiatry

## 2019-03-13 ENCOUNTER — Other Ambulatory Visit: Payer: BLUE CROSS/BLUE SHIELD | Admitting: Orthotics

## 2019-03-30 ENCOUNTER — Telehealth: Payer: Self-pay | Admitting: *Deleted

## 2019-03-30 NOTE — Telephone Encounter (Signed)
Faxed request for MRI disc printed in DiaCom format from the machine to be mailed to our office, from [facility] in [place] [facility]).

## 2019-03-30 NOTE — Telephone Encounter (Signed)
I informed pt Dr. Milinda Pointer had reviewed the MRI results and would like to send a copy of the MRI disc to a radiology specialist for mor details for treatment planning and there would be a 10-14 day delay in the final results and once received I would call with instructions. Pt states she had the MRI performed in [facility] and it was ordered by Dr. Milinda Pointer in 02/2019. I told pt I had a fax number for the MRI center and would fax the request directly to them for the MRI disc copy.

## 2019-03-30 NOTE — Telephone Encounter (Signed)
Dr. Milinda Pointer request copy of MRI 03/25/2019 disc for over read. I called [facility], was transferred by Gerald Stabs to Radiology and left message for the Radiology department to mail a copy of the MRI disc printed from the machine in DiaCom format to the [facility] [place]. Direct line to [place] per recording.

## 2019-04-06 NOTE — Telephone Encounter (Signed)
Called patient to schedule appt to come in for her fungal culture results and patient wanted to follow up and see if we had received her MRI disc.  Please let me know and I will call patient to schedule appt to come in for both the MRI results and Fungal results.

## 2019-04-06 NOTE — Telephone Encounter (Signed)
Colleen Clayton - [facility] states received the MRI disc today. Informed pt the MRI disc copy had been received and it may still take up to 10 days.

## 2019-04-18 ENCOUNTER — Encounter: Payer: Self-pay | Admitting: Podiatry

## 2019-04-18 ENCOUNTER — Telehealth: Payer: Self-pay | Admitting: *Deleted

## 2019-04-18 NOTE — Telephone Encounter (Signed)
-----   Message from Viviana Simpler, [facility] sent at 04/18/2019  9:26 AM EST ----- Colleen Clayton, patient called and wanted to know if the MRI disk was back from the reread and her phone number is [phone], and if the disc is then patient wants to be seen. Lattie Haw

## 2019-04-18 NOTE — Telephone Encounter (Signed)
I called [facility] and the report was ready and faxed. Dr. Milinda Pointer reviewed. He would like the patient to see Dr. Amalia Hailey for follow up of her ankle and to discuss the report further regarding treatment.

## 2019-05-08 ENCOUNTER — Telehealth: Payer: Self-pay | Admitting: *Deleted

## 2019-05-14 ENCOUNTER — Other Ambulatory Visit: Payer: Self-pay

## 2019-05-14 ENCOUNTER — Ambulatory Visit: Payer: PRIVATE HEALTH INSURANCE | Admitting: Podiatry

## 2019-05-14 DIAGNOSIS — M25572 Pain in left ankle and joints of left foot: Secondary | ICD-10-CM

## 2019-05-14 DIAGNOSIS — M659 Synovitis and tenosynovitis, unspecified: Secondary | ICD-10-CM | POA: Diagnosis not present

## 2019-05-14 DIAGNOSIS — M79676 Pain in unspecified toe(s): Secondary | ICD-10-CM

## 2019-05-14 DIAGNOSIS — B351 Tinea unguium: Secondary | ICD-10-CM | POA: Diagnosis not present

## 2019-05-14 MED ORDER — TERBINAFINE HCL 250 MG PO TABS: 250.0000 mg | ORAL_TABLET | Freq: Every day | ORAL | 0 refills | Status: AC

## 2019-05-22 NOTE — Progress Notes (Signed)
   HPI: 54 y.o. female presenting today as a referral from Dr. Milinda Pointer for evaluation and treatment regarding chronic left foot and ankle pain.  Patient recently had an MRI and presents today to review the MRI results and discuss further treatment options.  No past medical history on file.   Physical Exam: General: The patient is alert and oriented x3 in no acute distress.  Dermatology: Skin is warm, dry and supple bilateral lower extremities. Negative for open lesions or macerations.  Hyperkeratotic dystrophic nails noted 1-5 bilateral.  Vascular: Palpable pedal pulses bilaterally. No edema or erythema noted. Capillary refill within normal limits.  Neurological: Epicritic and protective threshold grossly intact bilaterally.   Musculoskeletal Exam: Range of motion within normal limits to all pedal and ankle joints bilateral. Muscle strength 5/5 in all groups bilateral.  There is pain on palpation overlying sinus tarsi as well as pain on palpation to the anterior medial and lateral aspects of the ankle joint left.  MRI impression 04/14/2019:  Intact syndesmotic, deltoid and lateral collateral complex ligaments.  No abnormality of the anterior, medial or lateral tendon groups.  Slight peritendinitis noted about the posterior tibialis tendon.  Grade III chondromalacia at the medial corner of the talar dome and subtle irregularity of the subchondral plate with subchondral arthropathic cysts.  No articular collapse.  Pes planus is identified.  The Achilles tendon is normal.  Moderate hypertrophic plantar fasciopathy is identified.  Soft tissue swelling noted within the sinus tarsi with near complete replacement of sinus tarsi adipose signal.  The sinus tarsi ligaments are intact.  Edema in a patchy fashion noted within the talar neck and extending toward the region of the head.  Developing intraosseous ganglion on at the inferior aspect of the talar neck.  Slight coarsening or thickening of the  trabecular pattern.  These findings are consistent with stress reaction.  Assessment: 1.  Sinus tarsitis left 2.  Ankle synovitis left 3.  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  Onychomycosis of toenails bilateral based on previous fungal culture   Plan of Care:  1. Patient evaluated.  MRI and fungal culture reviewed.  2.  Injection of 0.5 cc Celestone Soluspan injected into the sinus tarsi left.  I am hoping that injection into the sinus tarsi will help differentiate if the patient is having ankle pain versus sinus tarsi pain. 3.  Prescription for terbinafine 250 mg #90.  Patient denies any hepatic pathology and is otherwise healthy. 4.  Return to clinic in 3 weeks to discuss ankle arthroscopy versus continued sinus tarsi treatment      Edrick Kins, DPM [facility]  Dr. Edrick Kins, DPM    [address], [place] [address]                Office [phone]  Fax [phone]

## 2019-06-04 ENCOUNTER — Ambulatory Visit (INDEPENDENT_AMBULATORY_CARE_PROVIDER_SITE_OTHER): Payer: BLUE CROSS/BLUE SHIELD | Admitting: Podiatry

## 2019-06-04 ENCOUNTER — Other Ambulatory Visit: Payer: Self-pay

## 2019-06-04 DIAGNOSIS — L600 Ingrowing nail: Secondary | ICD-10-CM

## 2019-06-04 DIAGNOSIS — M65972 Unspecified synovitis and tenosynovitis, left ankle and foot: Secondary | ICD-10-CM

## 2019-06-04 DIAGNOSIS — M722 Plantar fascial fibromatosis: Secondary | ICD-10-CM

## 2019-06-04 DIAGNOSIS — B351 Tinea unguium: Secondary | ICD-10-CM

## 2019-06-04 DIAGNOSIS — M659 Synovitis and tenosynovitis, unspecified: Secondary | ICD-10-CM

## 2019-06-04 DIAGNOSIS — M79676 Pain in unspecified toe(s): Secondary | ICD-10-CM

## 2019-06-04 DIAGNOSIS — M25572 Pain in left ankle and joints of left foot: Secondary | ICD-10-CM | POA: Diagnosis not present

## 2019-06-04 MED ORDER — MELOXICAM 15 MG PO TABS: 15.0000 mg | ORAL_TABLET | Freq: Every day | ORAL | 1 refills | Status: DC

## 2019-06-04 MED ORDER — GENTAMICIN SULFATE 0.1 % EX CREA: 1.0000 "application " | TOPICAL_CREAM | Freq: Two times a day (BID) | CUTANEOUS | 1 refills | Status: AC

## 2019-06-04 NOTE — Patient Instructions (Addendum)

## 2019-06-06 NOTE — Telephone Encounter (Signed)
Entered in error

## 2019-06-06 NOTE — Progress Notes (Signed)
HPI: 54 y.o. female presenting today for follow up evaluation of left foot and ankle pain. She reports some improvement in the left foot pain. She states the injection has helped alleviate the symptoms.  She reports a flare up of right foot pain secondary to plantar fasciitis that began a few days ago. She has had injections in the past which have helped alleviate her symptoms and would like another one. Walking increases the pain. She has not done anything for treatment.  She also notes sharp pain to the medial border of the left great toe that began several weeks ago. She is concerned for an ingrown nail. Applying pressure to the area increases the pain. She has not done anything for treatment. Patient is here for further evaluation and treatment.   No past medical history on file.   Physical Exam: General: The patient is alert and oriented x3 in no acute distress.  Dermatology: Skin is warm, dry and supple bilateral lower extremities. Hyperkeratotic dystrophic nails noted 1-5 bilateral. Medial border of the left great toenail appears to be erythematous with evidence of an ingrowing nail. Pain on palpation noted to the border of the nail fold. The remaining nails appear unremarkable at this time. There are no open sores, lesions.  Vascular: Palpable pedal pulses bilaterally. No edema or erythema noted. Capillary refill within normal limits.  Neurological: Epicritic and protective threshold grossly intact bilaterally.   Musculoskeletal Exam: Range of motion within normal limits to all pedal and ankle joints bilateral. Muscle strength 5/5 in all groups bilateral.  There is pain on palpation overlying sinus tarsi as well as pain on palpation to the anterior medial and lateral aspects of the ankle joint left. Pain with palpation noted to the right heel along the plantar fascia.   MRI impression 04/14/2019:  Intact syndesmotic, deltoid and lateral collateral complex ligaments.  No abnormality of  the anterior, medial or lateral tendon groups.  Slight peritendinitis noted about the posterior tibialis tendon.  Grade III chondromalacia at the medial corner of the talar dome and subtle irregularity of the subchondral plate with subchondral arthropathic cysts.  No articular collapse.  Pes planus is identified.  The Achilles tendon is normal.  Moderate hypertrophic plantar fasciopathy is identified.  Soft tissue swelling noted within the sinus tarsi with near complete replacement of sinus tarsi adipose signal.  The sinus tarsi ligaments are intact.  Edema in a patchy fashion noted within the talar neck and extending toward the region of the head.  Developing intraosseous ganglion on at the inferior aspect of the talar neck.  Slight coarsening or thickening of the trabecular pattern.  These findings are consistent with stress reaction.  Assessment: 1. Sinus tarsitis left 2. Ankle synovitis left 3. 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 Onychomycosis of toenails bilateral based on previous fungal culture 5. Plantar fasciitis right - lateral band 6. Paronychia with ingrowing nail medial border left hallux  7. Pain in toe 8. Incurvated nail   Plan of Care:  1. Patient evaluated.  MRI and fungal culture reviewed.  2. Injection of 0.5 cc Celestone Soluspan injected into the sinus tarsi left.  I am hoping that injection into the sinus tarsi will help differentiate if the patient is having ankle pain versus sinus tarsi pain. 3. Injection of 0.5 mLs Celestone Soluspan injected into the lateral band of the right plantar fascia.  4. Prescription for Meloxicam provided to patient. 5. Continue taking Lamisil 250 mg daily.  6.  Discussed treatment alternatives and plan of care. Explained nail avulsion procedure and post procedure course to patient. 7. Patient opted for permanent partial nail avulsion of the medial border of the left great toe.  8. Prior to procedure, local  anesthesia infiltration utilized using 3 ml of a 50:50 mixture of 2% plain lidocaine and 0.5% plain marcaine in a normal hallux block fashion and a betadine prep performed.  9. Partial permanent nail avulsion with chemical matrixectomy performed using [id number] applications of phenol followed by alcohol flush.  10. Light dressing applied. 11. Prescription for Gentamicin cream provided to patient to use daily with a bandage.  12. Return to clinic in 6 weeks.      Edrick Kins, DPM [facility]  Dr. Edrick Kins, DPM    [address], [place] [address]                Office [phone]  Fax [phone]

## 2019-06-25 ENCOUNTER — Ambulatory Visit: Payer: PRIVATE HEALTH INSURANCE | Admitting: Podiatry

## 2019-12-26 ENCOUNTER — Ambulatory Visit (INDEPENDENT_AMBULATORY_CARE_PROVIDER_SITE_OTHER): Payer: BLUE CROSS/BLUE SHIELD

## 2019-12-26 ENCOUNTER — Encounter: Payer: Self-pay | Admitting: Podiatry

## 2019-12-26 ENCOUNTER — Other Ambulatory Visit: Payer: Self-pay

## 2019-12-26 ENCOUNTER — Ambulatory Visit (INDEPENDENT_AMBULATORY_CARE_PROVIDER_SITE_OTHER): Payer: BLUE CROSS/BLUE SHIELD | Admitting: Podiatry

## 2019-12-26 DIAGNOSIS — S92514A Nondisplaced fracture of proximal phalanx of right lesser toe(s), initial encounter for closed fracture: Secondary | ICD-10-CM

## 2019-12-26 DIAGNOSIS — M25572 Pain in left ankle and joints of left foot: Secondary | ICD-10-CM

## 2019-12-26 DIAGNOSIS — M659 Synovitis and tenosynovitis, unspecified: Secondary | ICD-10-CM | POA: Diagnosis not present

## 2019-12-26 DIAGNOSIS — M65172 Other infective (teno)synovitis, left ankle and foot: Secondary | ICD-10-CM

## 2019-12-26 MED ORDER — HYDROCODONE-ACETAMINOPHEN 5-325 MG PO TABS: 1.0000 | ORAL_TABLET | ORAL | 0 refills | Status: DC | PRN

## 2019-12-26 NOTE — Progress Notes (Signed)
HPI: 54 y.o. female presenting today for new complaint associated to an injury that occurred to the right fifth toe.  Patient states that she stubbed her right fifth toe against a vacuum while she was vacuuming.  She has had pain and tenderness ever since.  She has not done anything for treatment.  DOI: 12/24/2019.  She presents for further treatment and evaluation  She also has a longstanding history of left foot and ankle pain.  MRIs have been ordered in the past and she receives steroidal injections which do provide some relief.  She is also taking meloxicam.   No past medical history on file.   Physical Exam: General: The patient is alert and oriented x3 in no acute distress.  Dermatology: Skin is warm, dry and supple bilateral lower extremities.  No open lesions or wounds noted  Vascular: Palpable pedal pulses bilaterally.  There is some erythema and edema noted to the right fifth toe.  Capillary refill within normal limits.  Neurological: Epicritic and protective threshold grossly intact bilaterally.   Musculoskeletal Exam: Range of motion within normal limits to all pedal and ankle joints bilateral. Muscle strength 5/5 in all groups bilateral.  There is pain on palpation overlying sinus tarsi as well as pain on palpation to the anterior medial and lateral aspects of the ankle joint left.   Radiographic exam: Fracture noted to the head of the proximal phalanx left fifth toe.  Closed, nondisplaced.  Joint spaces preserved.  MRI impression 04/14/2019:  Intact syndesmotic, deltoid and lateral collateral complex ligaments.  No abnormality of the anterior, medial or lateral tendon groups.  Slight peritendinitis noted about the posterior tibialis tendon.  Grade III chondromalacia at the medial corner of the talar dome and subtle irregularity of the subchondral plate with subchondral arthropathic cysts.  No articular collapse.  Pes planus is identified.  The Achilles tendon is normal.   Moderate hypertrophic plantar fasciopathy is identified.  Soft tissue swelling noted within the sinus tarsi with near complete replacement of sinus tarsi adipose signal.  The sinus tarsi ligaments are intact.  Edema in a patchy fashion noted within the talar neck and extending toward the region of the head.  Developing intraosseous ganglion on at the inferior aspect of the talar neck.  Slight coarsening or thickening of the trabecular pattern.  These findings are consistent with stress reaction.  Assessment: 1. Sinus tarsitis left 2. Ankle synovitis left 3. 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  Fracture right fifth toe proximal phalanx, closed, nondisplaced, initial encounter  Plan of Care:  1. Patient evaluated.  X-rays reviewed today.  I explained to the patient that the fracture should heal on its own over the next few months 2.  Postsurgical shoe dispensed right foot weightbearing as tolerated 3.  Prescription for Vicodin 5/325 mg #30 every 4 hours as needed pain 4.  Injection of 0.5 cc Celestone Soluspan injected into the left ankle joint 5.  Return to clinic in 6 weeks for follow-up x-ray   Edrick Kins, DPM [facility]  Dr. Edrick Kins, DPM    [address], [place] [address]                Office [phone]  Fax [phone]

## 2020-02-06 ENCOUNTER — Ambulatory Visit: Payer: PRIVATE HEALTH INSURANCE | Admitting: Podiatry

## 2020-05-05 ENCOUNTER — Other Ambulatory Visit: Payer: Self-pay

## 2020-05-05 ENCOUNTER — Ambulatory Visit (INDEPENDENT_AMBULATORY_CARE_PROVIDER_SITE_OTHER): Payer: BLUE CROSS/BLUE SHIELD | Admitting: Podiatry

## 2020-05-05 DIAGNOSIS — M659 Synovitis and tenosynovitis, unspecified: Secondary | ICD-10-CM

## 2020-05-05 DIAGNOSIS — D1720 Benign lipomatous neoplasm of skin and subcutaneous tissue of unspecified limb: Secondary | ICD-10-CM | POA: Diagnosis not present

## 2020-05-05 MED ORDER — BETAMETHASONE SOD PHOS & ACET 6 (3-3) MG/ML IJ SUSP
3.0000 mg | Freq: Once | INTRAMUSCULAR | Status: AC
  Administered 2020-05-05: 3 mg via INTRA_ARTICULAR

## 2020-05-05 MED ORDER — MELOXICAM 15 MG PO TABS: 15.0000 mg | ORAL_TABLET | Freq: Every day | ORAL | 1 refills | Status: DC

## 2020-05-05 NOTE — Progress Notes (Signed)
HPI: 55 y.o. female presenting today for new complaint associated to an injury that occurred to the right fifth toe.  Patient states that she stubbed her right fifth toe against a vacuum while she was vacuuming.  She has had pain and tenderness ever since.  She has not done anything for treatment.  DOI: 12/24/2019.  She presents for further treatment and evaluation  She also has a longstanding history of left foot and ankle pain.  MRIs have been ordered in the past and she receives steroidal injections which do provide some relief.  She is also taking meloxicam.   No past medical history on file.   Physical Exam: General: The patient is alert and oriented x3 in no acute distress.  Dermatology: Skin is warm, dry and supple bilateral lower extremities.  No open lesions or wounds noted  Vascular: Palpable pedal pulses bilaterally.  There is some erythema and edema noted to the right fifth toe.  Capillary refill within normal limits.  Neurological: Epicritic and protective threshold grossly intact bilaterally.   Musculoskeletal Exam: Range of motion within normal limits to all pedal and ankle joints bilateral. Muscle strength 5/5 in all groups bilateral.  There is pain on palpation overlying sinus tarsi as well as pain on palpation to the anterior medial and lateral aspects of the ankle joint left.  Palpable ankle lipomas noted to the anterior lateral aspect of the ankle joints bilateral with symptomatic pain on palpation.  MRI impression 04/14/2019:  Intact syndesmotic, deltoid and lateral collateral complex ligaments.  No abnormality of the anterior, medial or lateral tendon groups.  Slight peritendinitis noted about the posterior tibialis tendon.  Grade III chondromalacia at the medial corner of the talar dome and subtle irregularity of the subchondral plate with subchondral arthropathic cysts.  No articular collapse.  Pes planus is identified.  The Achilles tendon is normal.  Moderate  hypertrophic plantar fasciopathy is identified.  Soft tissue swelling noted within the sinus tarsi with near complete replacement of sinus tarsi adipose signal.  The sinus tarsi ligaments are intact.  Edema in a patchy fashion noted within the talar neck and extending toward the region of the head.  Developing intraosseous ganglion on at the inferior aspect of the talar neck.  Slight coarsening or thickening of the trabecular pattern.  These findings are consistent with stress reaction.  Assessment: 1. Sinus tarsitis left 2. Ankle synovitis bilateral 3. 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  Ankle lipomas bilateral  Plan of Care:  1. Patient evaluated.  2.  Injection of 0.5 cc Celestone Soluspan injection of the bilateral ankle joints. 3.  Today we discussed different treatment options for the patient regarding her bilateral ankle pain.  Recommend that we pursue steroidal anti-inflammatories until she does not get long lasting relief.  At that point we may need to consider with surgical intervention including ankle arthroscopy.  If we are going to perform ankle arthroscopy I do recommend going ahead to surgically remove the ankle lipomas at the same time. 4.  In the meantime continue meloxicam as needed.  Refill provided 5.  Return to clinic as needed   Edrick Kins, DPM [facility]  Dr. Edrick Kins, DPM    [address]  [place], [place] [address]                Office [phone]  Fax [phone]

## 2020-06-18 ENCOUNTER — Telehealth: Payer: Self-pay | Admitting: Podiatry

## 2020-06-18 NOTE — Telephone Encounter (Signed)
Patient is requesting renewal on handicap decal. Will com in to pick up signed paper work for [name] (husband may come pick up today). Patient mentioned a "code" given to her by Dr. Amalia Hailey in regards to a surgical procedure he recommenced she have.

## 2020-06-18 NOTE — Telephone Encounter (Signed)
Okay to pick up handicap decal. Not sure about what "code"? - Dr. Amalia Hailey

## 2020-06-19 NOTE — Telephone Encounter (Signed)
Maybe she is referring to a diagnosis code. Not sure.

## 2020-07-02 ENCOUNTER — Ambulatory Visit: Payer: BC Managed Care – PPO | Admitting: Podiatry

## 2020-07-07 ENCOUNTER — Ambulatory Visit: Payer: BC Managed Care – PPO | Admitting: Podiatry

## 2020-08-18 ENCOUNTER — Other Ambulatory Visit: Payer: Self-pay

## 2020-08-18 ENCOUNTER — Ambulatory Visit (INDEPENDENT_AMBULATORY_CARE_PROVIDER_SITE_OTHER): Payer: BC Managed Care – PPO | Admitting: Podiatry

## 2020-08-18 ENCOUNTER — Ambulatory Visit (INDEPENDENT_AMBULATORY_CARE_PROVIDER_SITE_OTHER): Payer: BC Managed Care – PPO

## 2020-08-18 DIAGNOSIS — M76821 Posterior tibial tendinitis, right leg: Secondary | ICD-10-CM | POA: Diagnosis not present

## 2020-08-18 DIAGNOSIS — S9000XA Contusion of unspecified ankle, initial encounter: Secondary | ICD-10-CM | POA: Diagnosis not present

## 2020-08-18 DIAGNOSIS — M659 Synovitis and tenosynovitis, unspecified: Secondary | ICD-10-CM | POA: Diagnosis not present

## 2020-08-18 DIAGNOSIS — D1720 Benign lipomatous neoplasm of skin and subcutaneous tissue of unspecified limb: Secondary | ICD-10-CM

## 2020-08-25 MED ORDER — MELOXICAM 15 MG PO TABS: 15.0000 mg | ORAL_TABLET | Freq: Every day | ORAL | 1 refills | Status: AC

## 2020-08-25 MED ORDER — BETAMETHASONE SOD PHOS & ACET 6 (3-3) MG/ML IJ SUSP: 3.0000 mg | Freq: Once | INTRAMUSCULAR | Status: AC

## 2020-08-25 NOTE — Progress Notes (Signed)
HPI: 54 y.o. female presenting today for follow-up evaluation and treatment regarding recurring bilateral ankle pain.  Today the right is worse than the left.  The right ankle has been hurting for the last 3-4 weeks.  She has been unable to apply pressure or ambulate without pain.  She denies a history of injury.  Gradual onset.  She presents for further treatment and evaluation  No past medical history on file.   Physical Exam: General: The patient is alert and oriented x3 in no acute distress.  Dermatology: Skin is warm, dry and supple bilateral lower extremities.  No open lesions or wounds noted  Vascular: Palpable pedal pulses bilaterally.  There is some erythema and edema noted to the right fifth toe.  Capillary refill within normal limits.  Neurological: Epicritic and protective threshold grossly intact bilaterally.   Musculoskeletal Exam: Range of motion within normal limits to all pedal and ankle joints bilateral. Muscle strength 5/5 in all groups bilateral.  There is pain on palpation overlying sinus tarsi as well as pain on palpation to the anterior medial and lateral aspects of the ankle joint left.  Palpable ankle lipomas noted to the anterior lateral aspect of the ankle joints bilateral with symptomatic pain on palpation.  Radiographic exam 08/18/2020 BL ankles: Mild periarticular spurring noted to the bilateral ankle joints anterior portion.  Joint spaces appear to be mostly conserved with a congruent tibiotalar joint.  No fractures identified.  Normal osseous mineralization.   MRI impression 04/14/2019:  Intact syndesmotic, deltoid and lateral collateral complex ligaments.  No abnormality of the anterior, medial or lateral tendon groups.  Slight peritendinitis noted about the posterior tibialis tendon.  Grade III chondromalacia at the medial corner of the talar dome and subtle irregularity of the subchondral plate with subchondral arthropathic cysts.  No articular  collapse.  Pes planus is identified.  The Achilles tendon is normal.  Moderate hypertrophic plantar fasciopathy is identified.  Soft tissue swelling noted within the sinus tarsi with near complete replacement of sinus tarsi adipose signal.  The sinus tarsi ligaments are intact.  Edema in a patchy fashion noted within the talar neck and extending toward the region of the head.  Developing intraosseous ganglion on at the inferior aspect of the talar neck.  Slight coarsening or thickening of the trabecular pattern.  These findings are consistent with stress reaction.  Assessment: 1. Sinus tarsitis left 2. Ankle synovitis bilateral 3. 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  Ankle lipomas bilateral 5.  Posterior tibial tendinitis right  Plan of Care:  1. Patient evaluated.  2.  Injection of 0.5 cc Celestone Soluspan injection posterior tibial tendon sheath right  3. Today we discussed the conservative versus surgical management of the presenting pathology. The patient opts for surgical management. All possible complications and details of the procedure were explained. All patient questions were answered. No guarantees were expressed or implied. 4. Authorization for surgery was initiated today. Surgery will consist of left ankle arthroscopy with debridement.  Excision of postmenopausal lipoma left 5.  Prescription for meloxicam 15 mg daily 6.  Ankle brace dispensed.  Wear daily to the right ankle 7.  Return to clinic 1 week postop    Edrick Kins, DPM [facility]  Dr. Edrick Kins, DPM    [address]  [place], [place] [address]                Office [phone]  Fax [phone]

## 2020-08-27 ENCOUNTER — Ambulatory Visit: Payer: BC Managed Care – PPO | Admitting: Podiatry

## 2020-08-29 ENCOUNTER — Telehealth: Payer: Self-pay | Admitting: Urology

## 2020-08-29 NOTE — Telephone Encounter (Signed)
DOS - 09/18/20   EXCISION ANKLE LIPOMA LEFT --- 11406 ANKLE ARTHROSCPPY LEFT --- 64847   BCBS EFFECTIVE DATE - 05/06/20   PLAN DEDUCTIBLE - $3,500.00 W/ $2,072.18 REMAINING OUT OF POCKET - $7,000.00 W/ $2,883.37 REMAINING  COINSURANCE- 50%  COPAY - $0.00    NO PRIOR AUTH REQUIRED

## 2020-09-03 ENCOUNTER — Telehealth: Payer: Self-pay | Admitting: Podiatry

## 2020-09-03 NOTE — Telephone Encounter (Signed)
Pt called stating she was waiting on a call for an ankle brace that she was to have gotten last week at her appt but they did not have any in stock.She wears a size 9 shoe.  Estill Bamberg gave me the ankle stabilizer and I told pt we had it in the office and she can pick up. If needed one of the assistance can show her how to put it on.

## 2020-09-10 ENCOUNTER — Telehealth: Payer: Self-pay | Admitting: Podiatry

## 2020-09-10 NOTE — Telephone Encounter (Signed)
Patient is having bilateral ankle issues. She would like to cancel her surgery for her left ankle (since she will have to be weight bearing in the right side), so she can have her right ankle evaluated. Please advise

## 2020-09-10 NOTE — Telephone Encounter (Signed)
Just have her come in for an appt. - Dr. Amalia Hailey

## 2020-09-15 ENCOUNTER — Other Ambulatory Visit: Payer: Self-pay

## 2020-09-15 ENCOUNTER — Ambulatory Visit (INDEPENDENT_AMBULATORY_CARE_PROVIDER_SITE_OTHER): Payer: BC Managed Care – PPO | Admitting: Podiatry

## 2020-09-15 DIAGNOSIS — M66871 Spontaneous rupture of other tendons, right ankle and foot: Secondary | ICD-10-CM

## 2020-09-15 DIAGNOSIS — M76821 Posterior tibial tendinitis, right leg: Secondary | ICD-10-CM | POA: Diagnosis not present

## 2020-09-15 DIAGNOSIS — M659 Synovitis and tenosynovitis, unspecified: Secondary | ICD-10-CM | POA: Diagnosis not present

## 2020-09-15 DIAGNOSIS — D1720 Benign lipomatous neoplasm of skin and subcutaneous tissue of unspecified limb: Secondary | ICD-10-CM | POA: Diagnosis not present

## 2020-09-15 NOTE — Progress Notes (Signed)
HPI: 55 y.o. female presenting today for follow-up evaluation and treatment regarding recurring bilateral ankle pain.  Patient scheduled for surgery this Thursday, 09/18/2020, to the left ankle, however she is concerned because of her chronic RT ankle pain. She presents to discuss her options and for further treatment and evaluation.   No past medical history on file.   Physical Exam: General: The patient is alert and oriented x3 in no acute distress.  Dermatology: Skin is warm, dry and supple bilateral lower extremities.  No open lesions or wounds noted  Vascular: Palpable pedal pulses bilaterally.  There is some erythema and edema noted to the right fifth toe.  Capillary refill within normal limits.  Neurological: Epicritic and protective threshold grossly intact bilaterally.   Musculoskeletal Exam: Range of motion within normal limits to all pedal and ankle joints bilateral. Muscle strength 5/5 in all groups bilateral.  There is pain on palpation overlying sinus tarsi as well as pain on palpation to the anterior medial and lateral aspects of the ankle joint left.  Palpable ankle lipomas noted to the anterior lateral aspect of the ankle joints bilateral with symptomatic pain on palpation.  Patient also has pain along the PT tendon RT just posterior to the medial malleolus. Pain is acute on chronic.   Radiographic exam 08/18/2020 BL ankles: Mild periarticular spurring noted to the bilateral ankle joints anterior portion.  Joint spaces appear to be mostly conserved with a congruent tibiotalar joint.  No fractures identified.  Normal osseous mineralization.   MRI impression 04/14/2019:  Intact syndesmotic, deltoid and lateral collateral complex ligaments.  No abnormality of the anterior, medial or lateral tendon groups.  Slight peritendinitis noted about the posterior tibialis tendon.  Grade III chondromalacia at the medial corner of the talar dome and subtle irregularity of the subchondral  plate with subchondral arthropathic cysts.  No articular collapse.  Pes planus is identified.  The Achilles tendon is normal.  Moderate hypertrophic plantar fasciopathy is identified.  Soft tissue swelling noted within the sinus tarsi with near complete replacement of sinus tarsi adipose signal.  The sinus tarsi ligaments are intact.  Edema in a patchy fashion noted within the talar neck and extending toward the region of the head.  Developing intraosseous ganglion on at the inferior aspect of the talar neck.  Slight coarsening or thickening of the trabecular pattern.  These findings are consistent with stress reaction.  Assessment: 1. Sinus tarsitis left 2. Ankle synovitis bilateral 3. 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  Ankle lipomas bilateral 5.  Posterior tibial tendinitis/possible nontraumatic tear right  Plan of Care:  1. Patient evaluated.  2.  Today we are going to order an MRI RT ankle. Patient has been dealing with this RT PT tendon pain intermittently for over 6 months with no lasting relief. We will proceed with surgery to the LT ankle since the patient can be weightbearing to the bilateral lower extremities.  3. Note for work was provided.  4. Authorization for surgery was initiated last visit. Surgery will consist of left ankle arthroscopy with debridement.  Excision of postmenopausal lipoma left 5.  Continue meloxicam 15 mg daily 6.  Ankle brace dispensed.  Wear daily to the right ankle 7.  Return to clinic 1 week postop    Edrick Kins, DPM [facility]  Dr. Edrick Kins, DPM    [address]  [place], [place] [address]                Office [phone]  Fax [phone]

## 2020-09-18 ENCOUNTER — Other Ambulatory Visit: Payer: Self-pay | Admitting: Podiatry

## 2020-09-18 ENCOUNTER — Encounter: Payer: Self-pay | Admitting: Podiatry

## 2020-09-18 DIAGNOSIS — M659 Synovitis and tenosynovitis, unspecified: Secondary | ICD-10-CM

## 2020-09-18 DIAGNOSIS — D1724 Benign lipomatous neoplasm of skin and subcutaneous tissue of left leg: Secondary | ICD-10-CM

## 2020-09-18 MED ORDER — HYDROMORPHONE HCL 4 MG PO TABS: 4.0000 mg | ORAL_TABLET | ORAL | 0 refills | Status: DC | PRN

## 2020-09-18 NOTE — Progress Notes (Signed)
PRN postop 

## 2020-09-22 ENCOUNTER — Other Ambulatory Visit: Payer: Self-pay | Admitting: Podiatry

## 2020-09-22 ENCOUNTER — Other Ambulatory Visit: Payer: BC Managed Care – PPO

## 2020-09-22 ENCOUNTER — Telehealth: Payer: Self-pay | Admitting: *Deleted

## 2020-09-22 ENCOUNTER — Telehealth: Payer: Self-pay

## 2020-09-22 MED ORDER — OXYCODONE-ACETAMINOPHEN 10-325 MG PO TABS: 1.0000 | ORAL_TABLET | Freq: Four times a day (QID) | ORAL | 0 refills | Status: DC | PRN

## 2020-09-22 MED ORDER — HYDROMORPHONE HCL 4 MG PO TABS: 4.0000 mg | ORAL_TABLET | ORAL | 0 refills | Status: DC | PRN

## 2020-09-22 NOTE — Progress Notes (Signed)
Breakthrough pain

## 2020-09-22 NOTE — Telephone Encounter (Signed)
Rx percocet 10's sent to the pharmacy for breakthrough pain.  Recommend ice 20 minutes/h with elevation.  Thanks,- Dr. Amalia Hailey

## 2020-09-22 NOTE — Telephone Encounter (Signed)
Patient is calling and said that she is experiencing a different kind of pain now ,has been taking pain medicine around the clock,elevating but not icing Requesting something a little stronger.Please advise.

## 2020-09-22 NOTE — Telephone Encounter (Signed)
Contacted [facility] [phone], made me left a voicemail with all the information they request for a call back to schedule peer to peer.  Order # [id number]  [place] office number was the one I put down for a call back. Please refer call to Dr Ellard Artis or myself when they contact the office to [place].  Thank you

## 2020-09-23 NOTE — Telephone Encounter (Signed)
Contacted [facility] [phone], made me left a voicemail with all the information they request for a call back to schedule peer to peer.  Order # [id number]  [place] office number was the one I put down for a call back. Please refer call to Dr Ellard Artis or myself when they contact the office to [place].  Thank you

## 2020-09-24 ENCOUNTER — Other Ambulatory Visit: Payer: Self-pay

## 2020-09-24 ENCOUNTER — Ambulatory Visit (INDEPENDENT_AMBULATORY_CARE_PROVIDER_SITE_OTHER): Payer: BC Managed Care – PPO | Admitting: Podiatry

## 2020-09-24 ENCOUNTER — Encounter: Payer: Self-pay | Admitting: Podiatry

## 2020-09-24 VITALS — Temp 97.5°F

## 2020-09-24 DIAGNOSIS — Z9889 Other specified postprocedural states: Secondary | ICD-10-CM

## 2020-09-24 MED ORDER — DOXYCYCLINE HYCLATE 100 MG PO TABS: 100.0000 mg | ORAL_TABLET | Freq: Two times a day (BID) | ORAL | 0 refills | Status: DC

## 2020-09-24 NOTE — Progress Notes (Signed)
   Subjective:  Patient presents today status post left ankle arthroscopy with excision of postmenopausal lipoma left ankle. DOS: 09/18/2020.  Patient states that she is having a significant amount of pain left ankle.  She is unable to bear weight.  She is kept the dressings clean dry and intact as instructed.  She has mostly been nonweightbearing in the cam boot.  No past medical history on file.    Objective/Physical Exam Neurovascular status intact.  Pulses are palpable.  Skin incisions appear to be well coapted with sutures intact.  Moderate edema noted with ecchymosis throughout the foot and anterior aspect of the ankle and leg.  There is a small area along the lateral incision site where the lipoma was resected of purple discoloration along the incision site concerning for possible necrosis of the skin edge.  Is very small and localized to a small portion of the incision site. No purulence noted.  No crepitus.  Assessment: 1. s/p LT ankle arthroscopy with excision of LT ankle lipoma. DOS: 09/18/2020   Plan of Care:  1. Patient was evaluated. 2. Dressings changed.  3. Weight bearing as tolerated.  4. Because of the amount of ecchymosis around the incision site, Rx doxycycline 100mg  #20, more for prophylaxis 5. RTC 1 week   Edrick Kins, DPM [facility]  Dr. Edrick Kins, DPM    [address], [place] [address]                Office [phone]  Fax [phone]

## 2020-09-25 ENCOUNTER — Other Ambulatory Visit: Payer: BC Managed Care – PPO

## 2020-09-29 ENCOUNTER — Other Ambulatory Visit: Payer: Self-pay | Admitting: Podiatry

## 2020-09-29 ENCOUNTER — Telehealth: Payer: Self-pay | Admitting: *Deleted

## 2020-09-29 DIAGNOSIS — Z9889 Other specified postprocedural states: Secondary | ICD-10-CM

## 2020-09-29 MED ORDER — HYDROMORPHONE HCL 4 MG PO TABS: 4.0000 mg | ORAL_TABLET | ORAL | 0 refills | Status: DC | PRN

## 2020-09-29 NOTE — Telephone Encounter (Signed)
Patient is calling with concerns about her ankle incision, is turning from black to green, still in a great deal of pain. Please advise.

## 2020-09-29 NOTE — Telephone Encounter (Signed)
Sent order for shower chair to [facility], received confirmation, contacted patient.

## 2020-09-29 NOTE — Progress Notes (Signed)
PRN postop 

## 2020-09-29 NOTE — Telephone Encounter (Signed)
Thanks Ammie! - Dr. Amalia Hailey

## 2020-09-29 NOTE — Telephone Encounter (Signed)
Spoke with patient. I just placed an order for a shower chair. Ammie would you be able to follow up with that since Darrick Penna is out? Thanks, Dr. Amalia Hailey

## 2020-09-30 NOTE — Telephone Encounter (Signed)
completed

## 2020-10-01 ENCOUNTER — Other Ambulatory Visit: Payer: Self-pay

## 2020-10-01 ENCOUNTER — Other Ambulatory Visit: Payer: Self-pay | Admitting: Podiatry

## 2020-10-01 ENCOUNTER — Ambulatory Visit (INDEPENDENT_AMBULATORY_CARE_PROVIDER_SITE_OTHER): Payer: BC Managed Care – PPO | Admitting: Podiatry

## 2020-10-01 VITALS — Temp 99.0°F

## 2020-10-01 DIAGNOSIS — Z9889 Other specified postprocedural states: Secondary | ICD-10-CM

## 2020-10-01 LAB — CBC WITH DIFFERENTIAL/PLATELET
Absolute Monocytes: 541 cells/uL (ref 200–950)
Basophils Absolute: 138 cells/uL (ref 0–200)
Basophils Relative: 1.2 %
Eosinophils Absolute: 322 cells/uL (ref 15–500)
Eosinophils Relative: 2.8 %
HCT: 45.2 % — ABNORMAL HIGH (ref 35.0–45.0)
Hemoglobin: 15.7 g/dL — ABNORMAL HIGH (ref 11.7–15.5)
Lymphs Abs: 2197 cells/uL (ref 850–3900)
MCH: 30.5 pg (ref 27.0–33.0)
MCHC: 34.7 g/dL (ref 32.0–36.0)
MCV: 87.8 fL (ref 80.0–100.0)
MPV: 11.4 fL (ref 7.5–12.5)
Monocytes Relative: 4.7 %
Neutro Abs: 8303 cells/uL — ABNORMAL HIGH (ref 1500–7800)
Neutrophils Relative %: 72.2 %
Platelets: 138 10*3/uL — ABNORMAL LOW (ref 140–400)
RBC: 5.15 10*6/uL — ABNORMAL HIGH (ref 3.80–5.10)
RDW: 12.2 % (ref 11.0–15.0)
Total Lymphocyte: 19.1 %
WBC: 11.5 10*3/uL — ABNORMAL HIGH (ref 3.8–10.8)

## 2020-10-01 LAB — BASIC METABOLIC PANEL
BUN: 18 mg/dL (ref 7–25)
CO2: 22 mmol/L (ref 20–32)
Calcium: 10.1 mg/dL (ref 8.6–10.4)
Chloride: 97 mmol/L — ABNORMAL LOW (ref 98–110)
Creat: 0.61 mg/dL (ref 0.50–1.03)
Glucose, Bld: 246 mg/dL — ABNORMAL HIGH (ref 65–139)
Potassium: 4.3 mmol/L (ref 3.5–5.3)
Sodium: 133 mmol/L — ABNORMAL LOW (ref 135–146)

## 2020-10-01 NOTE — Progress Notes (Signed)
   Subjective:  Patient presents today status post left ankle arthroscopy with excision of postmenopausal lipoma left ankle. DOS: 09/18/2020.  Patient continues to be in a significant amount of pain associated to the left ankle.  She states that she is unable to apply pressure.  She has not been wearing the cam boot and simply resting her foot nonweightbearing.  She presents today with her husband  No past medical history on file.     Objective/Physical Exam Neurovascular status intact.  Pulses are palpable.  Skin incision to the lateral aspect of the ankle is coapted however the skin edges appear to be ischemic in nature with necrosis of the skin.  There is no drainage.  There is no malodor noted.  There is no warmth compared to the contralateral limb.  Ecchymosis and discoloration noted compared to the contralateral limb.  Exquisitely painful with palpation around the ankle joint  Assessment: 1. s/p LT ankle arthroscopy with excision of LT ankle lipoma. DOS: 09/18/2020   Plan of Care:  1. Patient was evaluated. 2.  Finish oral doxycycline 100 mg #20 as prescribed.  Again, I do not believe this is infectious in nature.  There is no purulence.  No drainage.  No heat. 3.  Due to the ecchymosis around the ankle joint and darkening of the skin as well as pain out of proportion to the surgery with necrosis along the skin edges, blood work was ordered today.  CBC and BMP. 4.  Recommend Betadine daily to the incision site 5.  1 suture from the medial anterior portal site of the ankle arthroscopy was removed today without any issues.  Sutures along the lateral incision site have been left intact today  6.  Return to clinic 1 week   Edrick Kins, DPM [facility]  Dr. Edrick Kins, DPM    [address], [place] [address]                Office [phone]  Fax [phone]

## 2020-10-04 ENCOUNTER — Other Ambulatory Visit: Payer: Self-pay | Admitting: Podiatry

## 2020-10-04 MED ORDER — HYDROMORPHONE HCL 4 MG PO TABS: 4.0000 mg | ORAL_TABLET | ORAL | 0 refills | Status: DC | PRN

## 2020-10-04 MED ORDER — DOXYCYCLINE HYCLATE 100 MG PO TABS: 100.0000 mg | ORAL_TABLET | Freq: Two times a day (BID) | ORAL | 0 refills | Status: DC

## 2020-10-05 ENCOUNTER — Other Ambulatory Visit: Payer: Self-pay | Admitting: Podiatry

## 2020-10-05 MED ORDER — HYDROMORPHONE HCL 4 MG PO TABS: 4.0000 mg | ORAL_TABLET | ORAL | 0 refills | Status: DC | PRN

## 2020-10-05 MED ORDER — DOXYCYCLINE HYCLATE 100 MG PO TABS: 100.0000 mg | ORAL_TABLET | Freq: Two times a day (BID) | ORAL | 0 refills | Status: AC

## 2020-10-06 ENCOUNTER — Encounter: Payer: Self-pay | Admitting: Podiatry

## 2020-10-06 ENCOUNTER — Ambulatory Visit (INDEPENDENT_AMBULATORY_CARE_PROVIDER_SITE_OTHER): Payer: BC Managed Care – PPO | Admitting: Podiatry

## 2020-10-06 ENCOUNTER — Other Ambulatory Visit: Payer: Self-pay

## 2020-10-06 DIAGNOSIS — Z9889 Other specified postprocedural states: Secondary | ICD-10-CM

## 2020-10-06 NOTE — Progress Notes (Signed)
   Subjective:  Patient presents today status post left ankle arthroscopy with excision of postmenopausal lipoma left ankle. DOS: 09/18/2020.  Patient continues to be in a significant amount of pain associated to the left ankle.  She states that she is unable to apply pressure.  No significant change since last visit  No past medical history on file.   Objective/Physical Exam Neurovascular status intact.  Pulses are palpable.  Skin incision to the lateral aspect of the ankle is coapted however the skin edges appear to be ischemic in nature with necrosis of the skin.  There is no drainage.  There is no malodor noted.  There is no warmth compared to the contralateral limb.  Ecchymosis and discoloration noted compared to the contralateral limb which does appear to have improved over the past week.  Exquisitely painful with palpation around the ankle joint  Assessment: 1. s/p LT ankle arthroscopy with excision of LT ankle lipoma. DOS: 09/18/2020   Plan of Care:  1. Patient was evaluated.  Blood work and labs were reviewed via telephone over this past weekend 2.  Finish oral doxycycline 100 mg #20 as prescribed.   3.  Order placed for MRI left ankle to rule out deep underlying hematoma that may be creating the pain out of proportion to the surgery 4.  Multilayer Unna boot was applied today clean dry and intact x1 week 5.  Return to clinic in 1 week  Edrick Kins, DPM [facility]  Dr. Edrick Kins, DPM    [address], [place] [address]                Office [phone]  Fax [phone]

## 2020-10-10 ENCOUNTER — Telehealth: Payer: Self-pay | Admitting: *Deleted

## 2020-10-10 MED ORDER — HYDROMORPHONE HCL 4 MG PO TABS: 4.0000 mg | ORAL_TABLET | ORAL | 0 refills | Status: DC | PRN

## 2020-10-10 NOTE — Telephone Encounter (Signed)
Returned call to patient and informed that prescription has been sent to pharmacy.

## 2020-10-10 NOTE — Telephone Encounter (Signed)
Patient is requesting a pain medicine refill of Hydromorphone-4 mg , sent to pharmacy([facility] in [place]) on file. Please advise.

## 2020-10-10 NOTE — Addendum Note (Signed)
Addended by: Hardie Pulley on: 10/10/2020 01:52 PM   Modules accepted: Orders

## 2020-10-13 ENCOUNTER — Telehealth: Payer: Self-pay | Admitting: *Deleted

## 2020-10-13 ENCOUNTER — Encounter: Payer: BC Managed Care – PPO | Admitting: Podiatry

## 2020-10-13 ENCOUNTER — Telehealth: Payer: Self-pay | Admitting: Podiatry

## 2020-10-13 NOTE — Telephone Encounter (Signed)
Please give patient a call asap, she has an appointment scheduled for today but may have transportation issues.

## 2020-10-13 NOTE — Telephone Encounter (Signed)
Patient is calling because her pain medicine has been sent to wrong pharmacy. Please resend. Spoke with patient and it has been resolved, will be in today for f/u.

## 2020-10-15 ENCOUNTER — Other Ambulatory Visit: Payer: Self-pay

## 2020-10-15 ENCOUNTER — Encounter: Payer: Self-pay | Admitting: Podiatry

## 2020-10-15 ENCOUNTER — Other Ambulatory Visit: Payer: Self-pay | Admitting: Podiatry

## 2020-10-15 ENCOUNTER — Ambulatory Visit (INDEPENDENT_AMBULATORY_CARE_PROVIDER_SITE_OTHER): Payer: BC Managed Care – PPO | Admitting: Podiatry

## 2020-10-15 DIAGNOSIS — Z9889 Other specified postprocedural states: Secondary | ICD-10-CM

## 2020-10-15 NOTE — Progress Notes (Signed)
   Subjective:  Patient presents today status post left ankle arthroscopy with excision of postmenopausal lipoma left ankle. DOS: 09/18/2020.  Patient states that there has been no significant change since last visit.  They continue to change the dressings daily.  No past medical history on file.      Objective/Physical Exam Neurovascular status intact.  Pulses are palpable.  Skin incision to the lateral aspect of the ankle is coapted however the skin edges appear to be ischemic in nature with necrosis of the skin with some superficial eschar around the anterior lateral portion of the ankle.  After removal of the central portion of the incision site there was some serosanguineous drainage noted.  There is no malodor noted.  Erythema and discoloration noted compared to the contralateral limb remains.  Exquisitely painful with palpation around the ankle joint  Assessment: 1. s/p LT ankle arthroscopy with excision of LT ankle lipoma. DOS: 09/18/2020 2.  Likely underlying hematoma possible abscess left   Plan of Care:  1. Patient was evaluated.   2.  Continue oral doxycycline as prescribed 3.  Today partial sutures removed.  I was able to express some serosanguineous fluid from the incision site and cultures were taken and sent to pathology 4.  After lengthy discussion with the patient and her husband, I do feel strongly that there is an underlying hematoma or abscess which is causing the increased erythema and increased pain.  I believe this would be best for the patient to return to the OR for incision and drainage with irrigation and debridement of the incision site.  Both the patient and the husband agree. All possible complications and details of the procedure were explained. All patient questions were answered. No guarantees were expressed or implied. 3. Authorization for surgery was initiated today. Surgery will consist of incision and drainage with irrigation and debridement left ankle 4.   MRI is still pending.  Ideally I would like to have the MRI completed prior to surgery.  Surgery scheduled for 10/23/2020 5.  Continue Betadine ointment with a light dressing daily  6.  Return to clinic 1 week postop   Edrick Kins, DPM [facility]  Dr. Edrick Kins, DPM    [address], [place] [address]                Office [phone]  Fax [phone]

## 2020-10-16 ENCOUNTER — Telehealth: Payer: Self-pay | Admitting: Urology

## 2020-10-16 ENCOUNTER — Telehealth: Payer: Self-pay | Admitting: Podiatry

## 2020-10-16 NOTE — Telephone Encounter (Signed)
DOS - 10/23/20  I & D WITH DEBRIDEMENT LEFT --- 10061   [employer]BS EFFECTIVE DATE - 05/06/20   PLAN DEDUCTIBLE - $3,500.00 W/ $0.00 REMAINING OUT OF POCKET - $7,000.00 W/ $1,834.06 REMAINING COINSURANCE - 0% COPAY - $0.00   NO PRIOR AUTH REQUIRED

## 2020-10-16 NOTE — Telephone Encounter (Signed)
Patient's husband called in inquiring whether the pain meds have been sent in yet.

## 2020-10-16 NOTE — Telephone Encounter (Signed)
Patient called the office wanting a requesting a refill on her pain medication.

## 2020-10-17 ENCOUNTER — Other Ambulatory Visit: Payer: Self-pay | Admitting: Podiatry

## 2020-10-17 ENCOUNTER — Other Ambulatory Visit: Payer: Self-pay

## 2020-10-17 MED ORDER — HYDROMORPHONE HCL 4 MG PO TABS: 4.0000 mg | ORAL_TABLET | Freq: Four times a day (QID) | ORAL | 0 refills | Status: DC | PRN

## 2020-10-17 NOTE — Telephone Encounter (Signed)
Patient requesting a call back from [place]. Also calling to check the status of medication refill; patient has not been contacted as of today regarding pick up.

## 2020-10-17 NOTE — Telephone Encounter (Signed)
Pain meds sent. -Dr. Amalia Hailey

## 2020-10-19 LAB — WOUND CULTURE
MICRO NUMBER:: 12226758
RESULT:: NO GROWTH
SPECIMEN QUALITY:: ADEQUATE

## 2020-10-19 LAB — HOUSE ACCOUNT TRACKING

## 2020-10-20 ENCOUNTER — Telehealth: Payer: Self-pay | Admitting: Podiatry

## 2020-10-20 ENCOUNTER — Encounter: Payer: BC Managed Care – PPO | Admitting: Podiatry

## 2020-10-20 NOTE — Telephone Encounter (Signed)
Spoke with patient on the phone regarding culture results, MRI on Wednesday, and surgery Thursday.  We are all on board and all questions were answered.-Dr. Amalia Hailey

## 2020-10-20 NOTE — Telephone Encounter (Signed)
Patient called and stated Dr. Amalia Hailey took a culture of her leg and she wanted to know the results before Thursday. Patient had some questions about her sx on Thursday as well.Please call her ASAP

## 2020-10-22 ENCOUNTER — Ambulatory Visit
Admission: RE | Admit: 2020-10-22 | Discharge: 2020-10-22 | Disposition: A | Discharge status: Home / Self Care | Payer: BC Managed Care – PPO | Source: Ambulatory Visit | Attending: Podiatry | Admitting: Podiatry

## 2020-10-22 ENCOUNTER — Other Ambulatory Visit: Payer: Self-pay

## 2020-10-22 DIAGNOSIS — Z9889 Other specified postprocedural states: Secondary | ICD-10-CM

## 2020-10-23 ENCOUNTER — Encounter: Payer: Self-pay | Admitting: Podiatry

## 2020-10-23 ENCOUNTER — Other Ambulatory Visit: Payer: Self-pay | Admitting: Podiatry

## 2020-10-23 DIAGNOSIS — L02416 Cutaneous abscess of left lower limb: Secondary | ICD-10-CM

## 2020-10-23 MED ORDER — HYDROMORPHONE HCL 4 MG PO TABS: 4.0000 mg | ORAL_TABLET | Freq: Four times a day (QID) | ORAL | 0 refills | Status: DC | PRN

## 2020-10-29 ENCOUNTER — Ambulatory Visit (INDEPENDENT_AMBULATORY_CARE_PROVIDER_SITE_OTHER): Payer: BC Managed Care – PPO | Admitting: Podiatry

## 2020-10-29 ENCOUNTER — Encounter: Payer: Self-pay | Admitting: Podiatry

## 2020-10-29 ENCOUNTER — Other Ambulatory Visit: Payer: Self-pay

## 2020-10-29 DIAGNOSIS — Z9889 Other specified postprocedural states: Secondary | ICD-10-CM

## 2020-10-29 MED ORDER — HYDROMORPHONE HCL 4 MG PO TABS: 4.0000 mg | ORAL_TABLET | Freq: Four times a day (QID) | ORAL | 0 refills | Status: DC | PRN

## 2020-10-29 NOTE — Progress Notes (Signed)
   Subjective:  Patient presents today status post incision and drainage with irrigation debridement left ankle. DOS: 10/23/2020.  Patient states that she feels significantly better there has been some significant improvement in reduction of the pain.  Overall significant improvement since prior surgery.  She presents today in a cam boot Endo Clip dressings have been clean dry and intact for the past week  No past medical history on file.    Objective/Physical Exam Neurovascular status intact.  Skin incisions appear to be well coapted with sutures intact. No sign of infectious process noted. No dehiscence.  The skin edges are actually well coapted.  There is no drainage.  No active bleeding noted.  Minimal edema noted to the surgical extremity.   Assessment: 1. s/p incision and drainage left ankle of left ankle hematoma. DOS: 10/23/2020   Plan of Care:  1. Patient was evaluated. 2.  Dressing change. 3.  Patient may begin to apply Silvadene cream with an Ace wrap daily.  Patient may begin washing and showering and getting the foot wet 4.  Patient may also begin to weight-bear in the cam boot. 5.  Return to clinic in 1 week for suture removals.  Patient will likely need physical therapy to rehab the foot and ankle back to activity    Edrick Kins, DPM [facility]  Dr. Edrick Kins, DPM    [address], [place] [address]                Office [phone]  Fax [phone]

## 2020-11-05 ENCOUNTER — Encounter: Payer: BC Managed Care – PPO | Admitting: Podiatry

## 2020-11-11 ENCOUNTER — Encounter: Payer: Self-pay | Admitting: Podiatry

## 2020-11-11 ENCOUNTER — Other Ambulatory Visit: Payer: Self-pay

## 2020-11-11 ENCOUNTER — Ambulatory Visit (INDEPENDENT_AMBULATORY_CARE_PROVIDER_SITE_OTHER): Payer: BC Managed Care – PPO | Admitting: Podiatry

## 2020-11-11 ENCOUNTER — Other Ambulatory Visit: Payer: Self-pay | Admitting: Podiatry

## 2020-11-11 DIAGNOSIS — Z9889 Other specified postprocedural states: Secondary | ICD-10-CM

## 2020-11-11 DIAGNOSIS — T8149XA Infection following a procedure, other surgical site, initial encounter: Secondary | ICD-10-CM

## 2020-11-11 MED ORDER — HYDROMORPHONE HCL 4 MG PO TABS: 4.0000 mg | ORAL_TABLET | Freq: Four times a day (QID) | ORAL | 0 refills | Status: DC | PRN

## 2020-11-11 NOTE — Addendum Note (Signed)
Addended by: Graceann Congress D on: 11/11/2020 03:31 PM   Modules accepted: Orders

## 2020-11-11 NOTE — Progress Notes (Addendum)
   Subjective:  Patient presents today status post incision and drainage with irrigation debridement left ankle. DOS: 10/23/2020.  Patient states that there continues to be some steady improvement and reduction of the pain.  She is still unable to completely walk and weight-bear on her foot.  No past medical history on file.    Objective/Physical Exam Neurovascular status intact.  Skin incisions appear to be well coapted with sutures intact. No sign of infectious process noted.  The skin edges are actually well coapted.  There is no drainage.  No active bleeding noted.  Minimal edema noted to the surgical extremity.  After removal of the sutures and light debridement of the superficial eschar there is a small area approximately 0.5 cm in length along the incision site that appears to be open with some serosanguineous drainage.  No purulence noted.  No malodor noted.   Assessment: 1. s/p incision and drainage left ankle of left ankle hematoma. DOS: 10/23/2020   Plan of Care:  1. Patient was evaluated. 2.  Sutures were removed today.  Recommend triple antibiotic ointment and a light Band-Aid over the small area of drainage along the incision site 3.  Overall the patient is doing significantly better.  She does have some hypersensitivity and pain to the ankle area extending down all the way to the toes. 4.  Prescription provided today for physical therapy at [facility] to increase range of motion and stretching and strengthening as well as reduction in swelling and pain 5.  Just to be safe in regards to the area of dehiscence, cultures were taken and sent to pathology  6.  Refill prescription for return to clinic in 3 weeks  Edrick Kins, DPM [facility]  Dr. Edrick Kins, DPM    [address], [place] [address]                Office [phone]  Fax [phone]

## 2020-11-14 ENCOUNTER — Other Ambulatory Visit: Payer: Self-pay | Admitting: Sports Medicine

## 2020-11-14 ENCOUNTER — Telehealth: Payer: Self-pay | Admitting: Podiatry

## 2020-11-14 LAB — WOUND CULTURE: Organism ID, Bacteria: NONE SEEN

## 2020-11-14 MED ORDER — HYDROMORPHONE HCL 4 MG PO TABS: 4.0000 mg | ORAL_TABLET | Freq: Four times a day (QID) | ORAL | 0 refills | Status: AC | PRN

## 2020-11-14 NOTE — Telephone Encounter (Signed)
Pt called and her medication was sent to the wrong pharmacy. Could you please send it to [facility] [address] in [place]. If we could make this her main pharmacy and take out the one in [place].

## 2020-11-14 NOTE — Progress Notes (Signed)
Changed Rx for Dilaudid to the correct pharmacy [facility]

## 2020-11-15 NOTE — Telephone Encounter (Signed)
Thank you :)

## 2020-12-10 ENCOUNTER — Encounter: Payer: BC Managed Care – PPO | Admitting: Podiatry

## 2020-12-15 ENCOUNTER — Telehealth: Payer: Self-pay | Admitting: Podiatry

## 2020-12-15 NOTE — Telephone Encounter (Signed)
Yes totally fine. Please provide letter. - Dr. Amalia Hailey

## 2020-12-15 NOTE — Telephone Encounter (Signed)
Patient and her husband have requested a letter for jury summons-explaining her disability and how it affects her ability to serve since her husband is her care giver

## 2020-12-18 ENCOUNTER — Encounter: Payer: Self-pay | Admitting: Podiatry

## 2020-12-31 ENCOUNTER — Ambulatory Visit (INDEPENDENT_AMBULATORY_CARE_PROVIDER_SITE_OTHER): Payer: BC Managed Care – PPO | Admitting: Podiatry

## 2020-12-31 ENCOUNTER — Other Ambulatory Visit: Payer: Self-pay

## 2020-12-31 ENCOUNTER — Encounter: Payer: Self-pay | Admitting: Podiatry

## 2020-12-31 DIAGNOSIS — Z9889 Other specified postprocedural states: Secondary | ICD-10-CM

## 2020-12-31 MED ORDER — GABAPENTIN 100 MG PO CAPS: 100.0000 mg | ORAL_CAPSULE | Freq: Three times a day (TID) | ORAL | 1 refills | Status: DC

## 2020-12-31 NOTE — Progress Notes (Signed)
   Subjective:  Patient presents today status post incision and drainage with irrigation debridement left ankle. DOS: 10/23/2020.  Patient states that she is improving but she continues to have significant pain and stiffness in her foot and ankle.  She is going to physical therapy but she says that they are very gentle and tender with her and not aggressive in her rehab regimen.  No past medical history on file.    Objective/Physical Exam Neurovascular status intact.  Skin incisions appear to be well coapted and healed nicely.  There is some residual edema throughout the foot and ankle.  Range of motion throughout the foot and ankle is somewhat limited and restricted.  Patient is having stiffness and some tenderness with end range of motion especially with the subtalar joint inversion and eversion.  Hypersensitivity with light touch also noted throughout the foot.   Assessment: 1. s/p incision and drainage left ankle of left ankle hematoma. DOS: 10/23/2020 2.  Postoperative neuritis left foot  Plan of Care:  1. Patient was evaluated. 2.  Explained to the patient that I need her to do aggressive range of motion and physical therapy exercises.  Recommend daily range of motion and massage therapy to help reduce the sensitivity of the foot, reduce the edema, improve range of motion, and strength in the foot and ankle 3.  Continue work from home restrictions 4.  Patient may weight-bear as tolerated.  Recommend good supportive tennis shoes that are loose fitting and do not constrict the foot 5.  Recommend daily foot lotion massage 6.  Handicap parking placard provided 7.  Prescription for gabapentin 100 mg 3 times daily 8.  Return to clinic in 6 weeks  Edrick Kins, DPM [facility]  Dr. Edrick Kins, DPM    [address], [place] [address]                Office [phone]  Fax [phone]

## 2021-02-06 ENCOUNTER — Other Ambulatory Visit: Payer: Self-pay | Admitting: Podiatry

## 2021-02-06 NOTE — Telephone Encounter (Signed)
Please advise 

## 2021-02-11 ENCOUNTER — Encounter: Payer: BC Managed Care – PPO | Admitting: Podiatry

## 2021-04-20 ENCOUNTER — Ambulatory Visit (INDEPENDENT_AMBULATORY_CARE_PROVIDER_SITE_OTHER): Payer: BC Managed Care – PPO | Admitting: Podiatry

## 2021-04-20 ENCOUNTER — Other Ambulatory Visit: Payer: Self-pay

## 2021-04-20 ENCOUNTER — Encounter: Payer: Self-pay | Admitting: Podiatry

## 2021-04-20 ENCOUNTER — Ambulatory Visit (INDEPENDENT_AMBULATORY_CARE_PROVIDER_SITE_OTHER): Payer: BC Managed Care – PPO

## 2021-04-20 DIAGNOSIS — G90522 Complex regional pain syndrome I of left lower limb: Secondary | ICD-10-CM | POA: Diagnosis not present

## 2021-04-20 DIAGNOSIS — Z9889 Other specified postprocedural states: Secondary | ICD-10-CM

## 2021-04-20 NOTE — Progress Notes (Signed)
° °  Subjective:  Patient presents today status post incision and drainage with irrigation debridement left ankle. DOS: 10/23/2020.  Patient continues to have chronic severe pain to her left foot and ankle.  There is even pain associated to light touch.  Patient states that there has been some very slow steady improvement however she is not in a position to return to work.  She continues to have an antalgic gait especially with pressure on her ankle.  She presents for further treatment and evaluation  No past medical history on file.   No past surgical history on file. Allergies  Allergen Reactions   Blue Dyes (Parenteral)     Other reaction(s): Other vomiting   Raylynne Cubbage Blue Other (See Comments)    vomiting vomiting    Neomycin Swelling and Other (See Comments)    Unknown   Metformin Other (See Comments)   Hydrochlorothiazide Palpitations   Iodinated Contrast Media Nausea And Vomiting and Other (See Comments)    Other reaction(s): Vomiting (intolerance)   Vancomycin Rash    Objective/Physical Exam Neurovascular status intact.  Skin incisions appear to be well coapted and healed nicely. Today there is minimal edema.  There continues to be hypersensitivity with light touch also noted throughout the dorsum of the foot and ankle.  Range of motion is within normal limits   Assessment: 1. s/p incision and drainage left ankle of left ankle hematoma. DOS: 10/23/2020 2.  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 Patient was evaluated. 2.  Continue daily range of motion exercises  3.  Continue weightbearing as tolerated in good supportive shoes and sneakers 4.  Patient is no longer taking the gabapentin. 5.  Referral to neurology for a second opinion regarding the patient's possible CRPS to the left foot 6.  Note for work was provided to continue working from home 7.  Return to clinic 3 months  Edrick Kins, DPM [facility]  Dr. Edrick Kins, DPM    [address], [place] [address]                Office [phone]  Fax [phone]

## 2021-05-25 ENCOUNTER — Telehealth: Payer: Self-pay | Admitting: Podiatry

## 2021-05-25 NOTE — Telephone Encounter (Signed)
Pt called and has never heard from neurologist office and upon checking the referral they do not see pts for crps they said to send pt to pain management office. Please send new referral to pain management if you are ok with that and just have someone notify pt please. ?

## 2021-05-29 ENCOUNTER — Other Ambulatory Visit: Payer: Self-pay | Admitting: Podiatry

## 2021-05-29 DIAGNOSIS — Z9889 Other specified postprocedural states: Secondary | ICD-10-CM

## 2021-05-29 DIAGNOSIS — G90522 Complex regional pain syndrome I of left lower limb: Secondary | ICD-10-CM

## 2021-05-29 NOTE — Telephone Encounter (Signed)
Order placed for [facility].  Please notify patient.  Thanks.- Dr. Amalia Hailey

## 2021-06-01 NOTE — Telephone Encounter (Signed)
Left message for pt that Dr Amalia Hailey sent a referral/order to [facility].Marland KitchenMarland Kitchen ?

## 2021-06-10 ENCOUNTER — Encounter: Payer: Self-pay | Admitting: Physical Medicine & Rehabilitation

## 2021-06-15 ENCOUNTER — Telehealth: Payer: Self-pay | Admitting: Podiatry

## 2021-06-15 NOTE — Telephone Encounter (Signed)
Patient called this afternoon wants to know if you will renew her handicap placard? Please advise

## 2021-06-29 NOTE — Telephone Encounter (Signed)
Patient called needs handicap placard ppwk states placard expires at the end of April 07/05/21, please advise ?

## 2021-06-29 NOTE — Telephone Encounter (Signed)
Okay to renew. - Dr. Amalia Hailey

## 2021-07-30 ENCOUNTER — Encounter: Payer: BC Managed Care – PPO | Admitting: Physical Medicine & Rehabilitation

## 2021-10-01 ENCOUNTER — Encounter: Payer: BC Managed Care – PPO | Admitting: Physical Medicine & Rehabilitation

## 2021-12-24 ENCOUNTER — Telehealth: Payer: Self-pay | Admitting: Podiatry

## 2021-12-24 NOTE — Telephone Encounter (Signed)
Pt wants to know if she can renew her handicap placard for another 6 months?  Please advise.

## 2021-12-24 NOTE — Telephone Encounter (Signed)
Sure that's fine. - Dr. Amalia Hailey

## 2021-12-25 ENCOUNTER — Telehealth: Payer: Self-pay | Admitting: *Deleted

## 2021-12-25 IMAGING — MR MR ANKLE*L* W/O CM
5 series · 36 of 40 positions shown · non-contrast
Comparison: MRI 03/25/2019

CLINICAL DATA: History of recent ankle surgery for lipoma excision
09/18/2020. Fluid leakage from surgical incision.

EXAM:
MRI OF THE LEFT ANKLE WITHOUT CONTRAST
TECHNIQUE: Multiplanar, multisequence MR imaging of the ankle was performed. No
intravenous contrast was administered.

[Series 5: T2 fat-sat · axial · 3.0mm · 0.50mm/px · z∈[-140,-13]mm · 9 of 34 slices shown (1 of 2)]
[im 1/34]
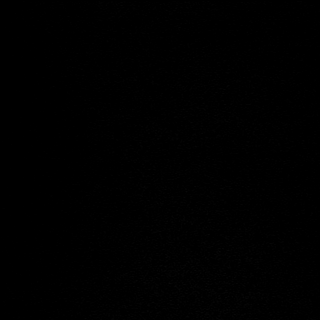
[im 5/34]
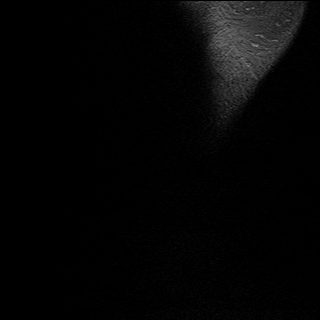
[im 9/34]
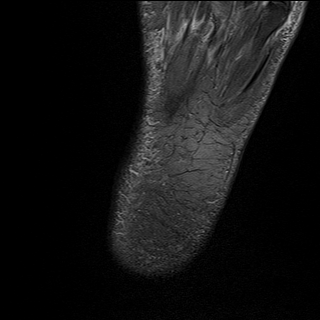
[im 13/34]
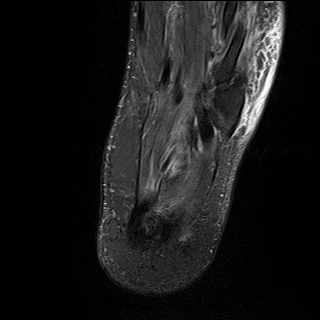
[im 17/34]
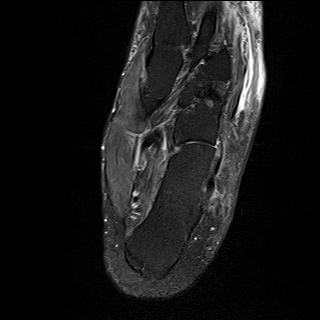
[im 21/34]
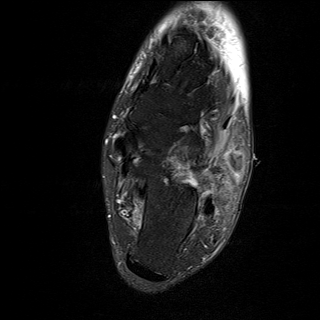
[im 25/34]
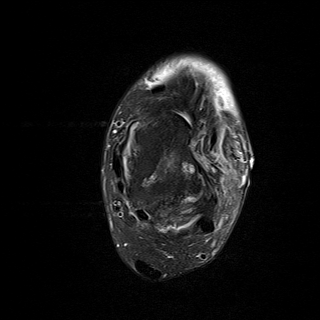
[im 29/34]
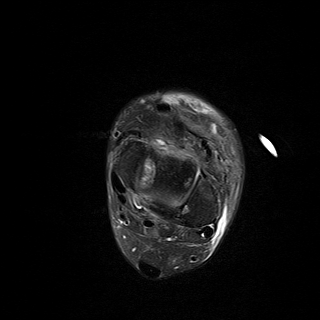
[im 34/34]
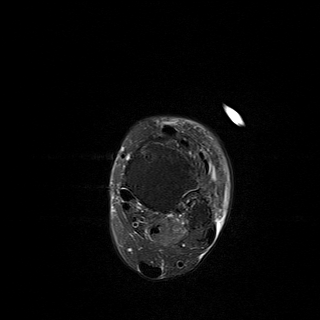

[Series 6: T1 · sagittal · 4.0mm · 0.56mm/px · 6 of 20 slices shown]
[im 1/20]
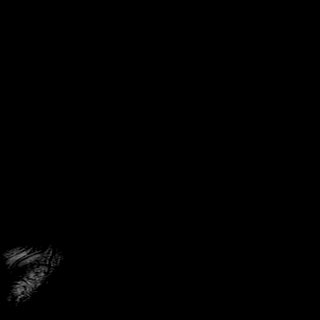
[im 4/20]
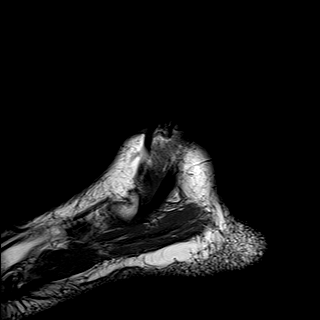
[im 8/20]
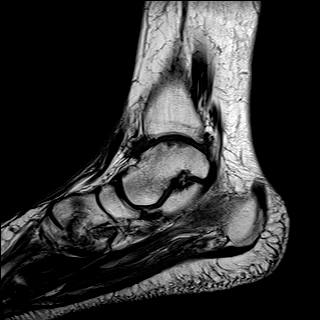
[im 12/20]
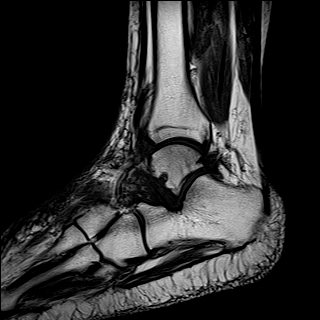
[im 16/20]
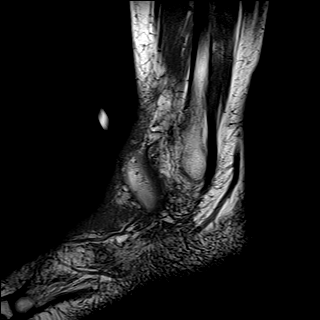
[im 20/20]
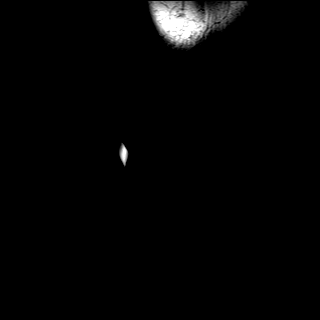

[Series 8: T2 fat-sat · coronal · 3.0mm · 0.50mm/px · 8 of 36 slices shown (2 of 2)]
[im 1/36]
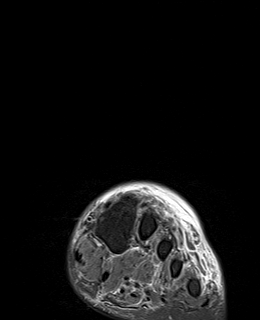
[im 4/36]
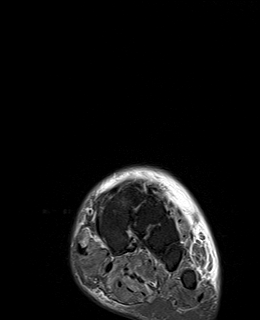
[im 12/36]
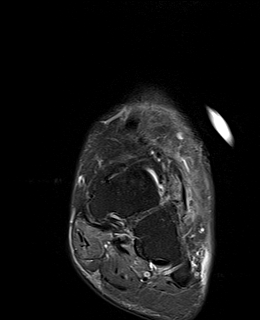
[im 16/36]
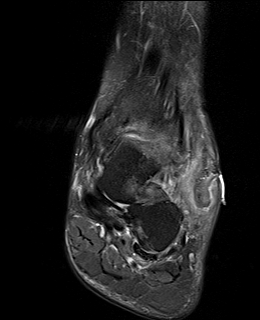
[im 20/36]
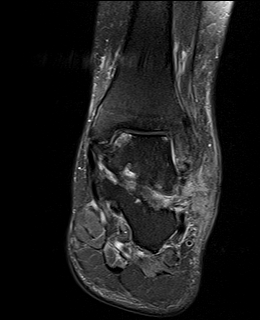
[im 24/36]
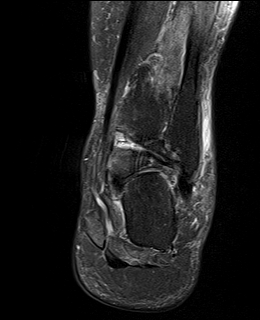
[im 32/36]
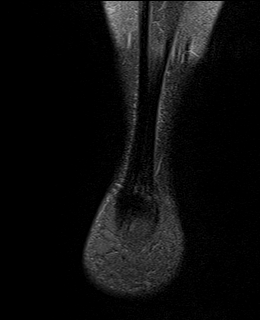
[im 36/36]
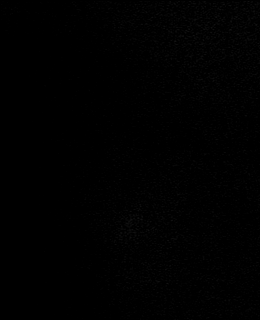

[Series 9: PD fat-sat · axial · 3.0mm · 0.42mm/px · z∈[-126,+2]mm · 9 of 34 slices shown]
[im 1/34]
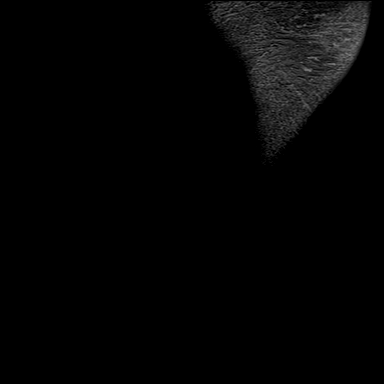
[im 5/34]
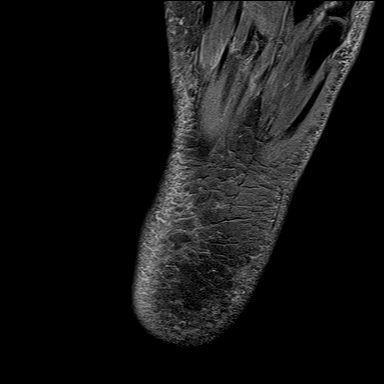
[im 9/34]
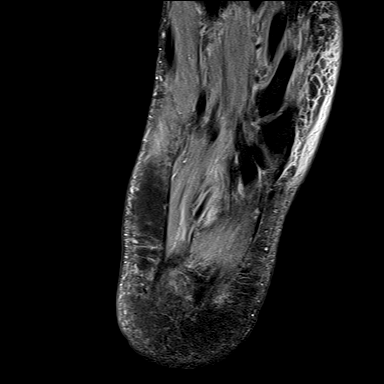
[im 13/34]
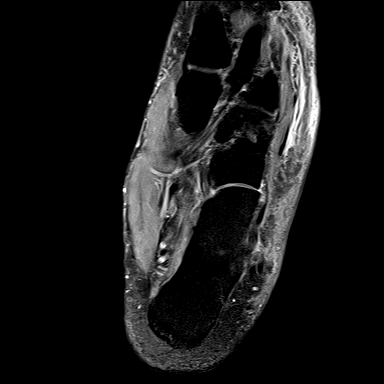
[im 17/34]
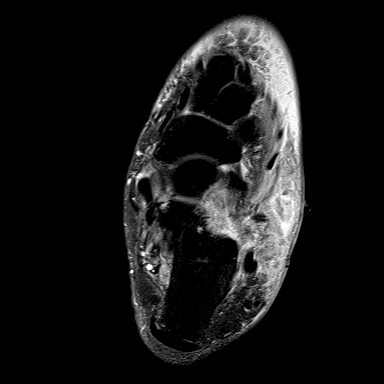
[im 21/34]
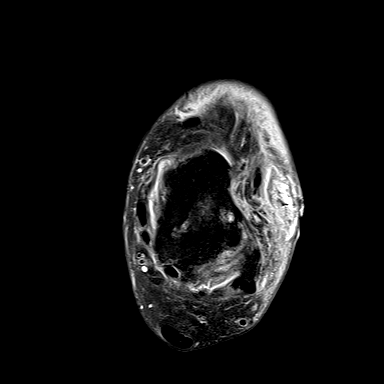
[im 25/34]
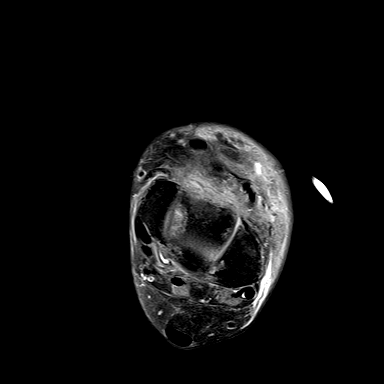
[im 29/34]
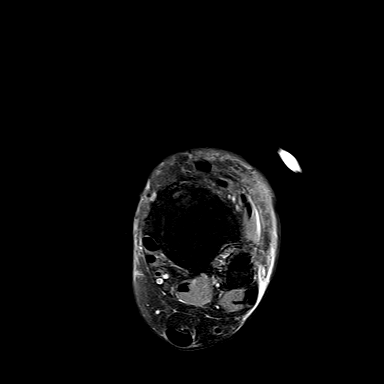
[im 34/34]
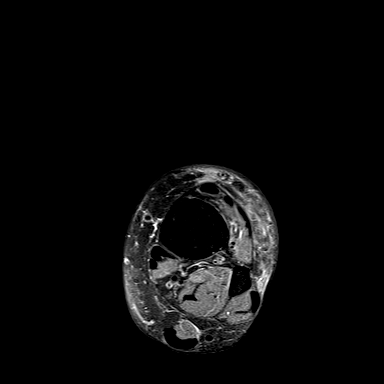

[Series 10: STIR · sagittal · 4.0mm · 0.35mm/px · 4 of 20 slices shown]
[im 1/20]
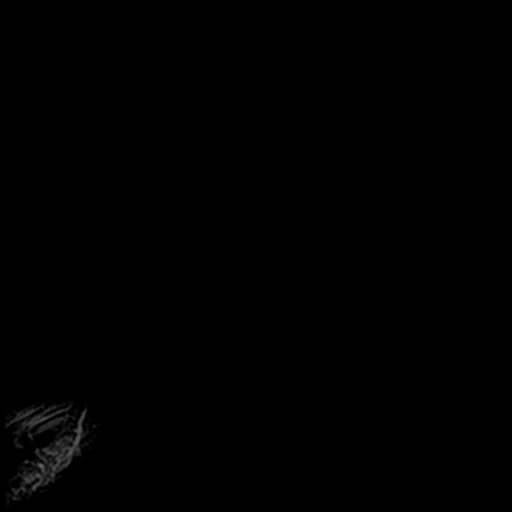
[im 4/20]
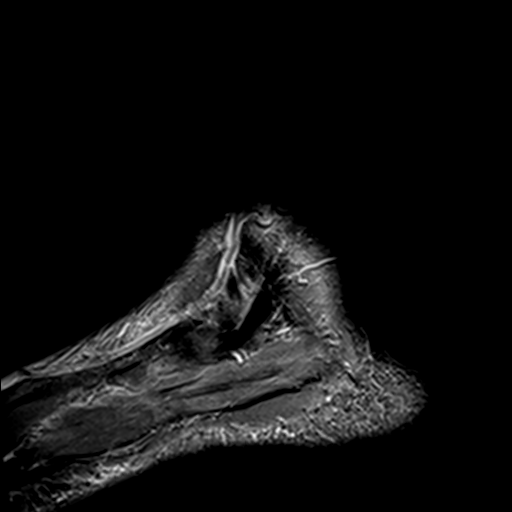
[im 8/20]
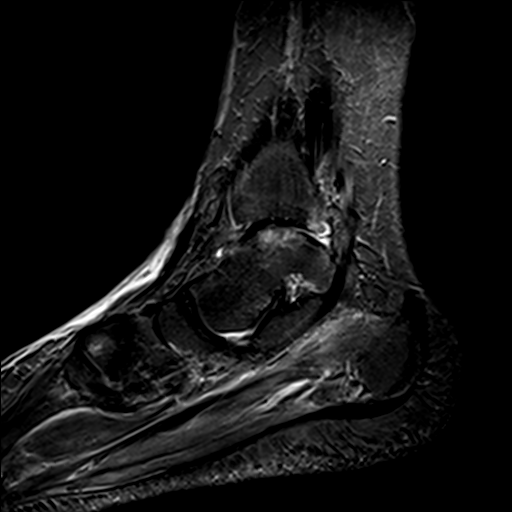
[im 12/20]
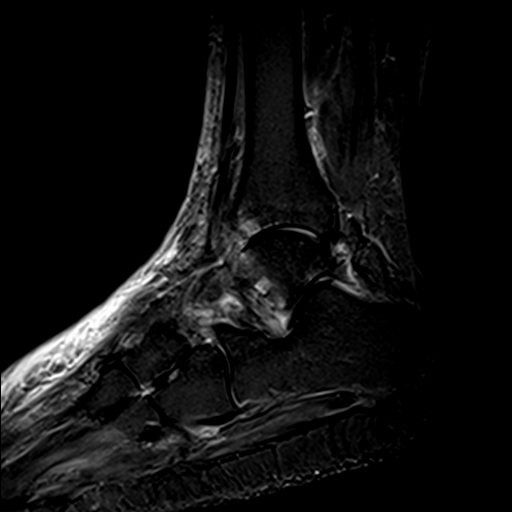

[36 of 40 positions shown; findings below may reference images not displayed]

FINDINGS: There is an open wound noted on the lateral aspect of the ankle. In
the subcutaneous fat there is a 3.3 x 3.2 cm lesion demonstrating
increased T1 and low T2 signal intensity suggesting a hematoma.
Moderate surrounding inflammation and moderate subcutaneous soft
tissue swelling/edema/fluid mainly along the dorsum of the ankle and
foot. Difficult to exclude an abscess without contrast.

TENDONS

Peroneal: Intact

Posteromedial: Intact

Anterior: Intact

Achilles: Intact

Plantar Fascia: Intact

LIGAMENTS

Lateral: Intact

Medial: Intact

CARTILAGE

Ankle Joint: Moderate degenerative changes with cartilage thinning,
joint space narrowing, spurring and subchondral cystic change.
Stable osteochondral lesion involving the medial talar dome.

Subtalar Joints/Sinus Tarsi: Persistent subtalar joint degenerative
changes, advanced for age. There is also changes of sinus tarsi
syndrome with diffuse inflammation/edema and fluid in the sinus
tarsi.

Bones: Resolution of marrow edema seen in the talus on the prior
study. No definite fracture. Stable talar dome osteochondral lesion
medially. The midfoot bony structures are intact. No stress
fracture.

Other: Unremarkable foot and ankle musculature.
IMPRESSION: 1. Open wound on the lateral aspect of the ankle with a 3.3 x 3.2 cm
lesion in the subcutaneous fat suggesting a hematoma. Surrounding
inflammation/edema/fluid. Difficult to exclude an abscess without
contrast.
2. Intact medial and lateral ankle ligaments and tendons.
3. Stable appearing tibiotalar joint degenerative changes and
osteochondral lesion involving the medial talar dome.
4. Persistent subtalar joint degenerative changes and changes of
sinus tarsi syndrome.

## 2021-12-25 NOTE — Telephone Encounter (Signed)
Handicap placard has been approved and given to patient 12/25/21.

## 2022-05-17 ENCOUNTER — Ambulatory Visit (INDEPENDENT_AMBULATORY_CARE_PROVIDER_SITE_OTHER): Payer: BC Managed Care – PPO | Admitting: Podiatry

## 2022-05-17 ENCOUNTER — Encounter: Payer: Self-pay | Admitting: Podiatry

## 2022-05-17 ENCOUNTER — Ambulatory Visit (INDEPENDENT_AMBULATORY_CARE_PROVIDER_SITE_OTHER): Payer: BC Managed Care – PPO

## 2022-05-17 DIAGNOSIS — M659 Synovitis and tenosynovitis, unspecified: Secondary | ICD-10-CM

## 2022-05-17 DIAGNOSIS — G8929 Other chronic pain: Secondary | ICD-10-CM | POA: Diagnosis not present

## 2022-05-17 DIAGNOSIS — M25572 Pain in left ankle and joints of left foot: Secondary | ICD-10-CM

## 2022-05-17 DIAGNOSIS — G5792 Unspecified mononeuropathy of left lower limb: Secondary | ICD-10-CM

## 2022-05-17 NOTE — Progress Notes (Signed)
Chief Complaint  Patient presents with   Foot Pain    Patient came in today for a left foot pain and swelling follow-up, x-rays taken today, right ankle is feeling weak, rate of pain 5 out of 10,      Subjective:  Patient presents today PSxHx ankle arthroscopy with excision of lipoma lateral aspect of the left ankle.  DOS: 09/18/2020.  Patient developed hematoma around the incision site and underwent subsequent drainage with irrigation left ankle. DOS: 10/23/2020.   Patient states that she continues to have chronic severe pain to her left foot and ankle.  She was last seen here in the office 213 2023 over 1-year ago.  She says that since that time she has had continued pain and tenderness to the left ankle.  She is unable to walk any long distance without pain and she says that shoes irritate her ankle.  She presents for further treatment and evaluation  No past medical history on file.   No past surgical history on file. Allergies  Allergen Reactions   Blue Dyes (Parenteral)     Other reaction(s): Other vomiting   Keonta Alsip Blue Other (See Comments)    vomiting vomiting    Neomycin Swelling and Other (See Comments)    Unknown   Metformin Other (See Comments)   Hydrochlorothiazide Palpitations   Iodinated Contrast Media Nausea And Vomiting and Other (See Comments)    Other reaction(s): Vomiting (intolerance)   Vancomycin Rash    Objective: Physical Exam General: The patient is alert and oriented x3 in no acute distress.  Dermatology: Skin is cool, dry and supple bilateral lower extremities.  The incision sites have healed nicely.  No hypertrophic scar or keloid formation.  Vascular: Palpable pedal pulses bilaterally. No edema or erythema noted. Capillary refill within normal limits.  Neurological: Light touch and protective threshold grossly intact bilaterally.  Paresthesia with touch along the lateral aspect of the ankle extending to the foot.  Musculoskeletal Exam: Range of  motion within normal limits.  There is pain on palpation throughout the left ankle.  The patient does have decreased dorsiflexion of her toes against resistance compared to the contralateral limb.  Radiographic exam LT ankle 05/17/2022: Mostly unchanged compared to prior x-rays.  No acute fractures identified.  Diffuse osseous demineralization consistent with possible osteopenia of the bones.  Joint spaces are mostly preserved.  No periarticular spurring noted.   Assessment: 1. s/p excision lipoma with ankle arthroscopy LT ankle. DOS: 09/18/2020 2.  Subsequent incision and drainage LT ankle hematoma. DOS: 10/23/2020 2.  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  Chronic severe ankle pain left 4.  Loss of dorsiflexion of the toes left  -Patient evaluated.  X-rays reviewed -The patient has now had chronic pain and tenderness ever since surgery about 1-1/2 years ago.  MRI ordered LT ankle wo contrast -Also due to the muscle weakness and loss of dorsiflexion strength I did order nerve conduction test to rule out any nerve related pathology -Patient currently wears [employer] shoes which are medium with and somewhat narrow.  Recommend wider fitting shoes that do not constrict the foot.  Recommended [employer] -Will plan to call patient and discussed the results of the MRI and nerve conduction study after results are available  Edrick Kins, DPM [facility]  Dr. Edrick Kins, DPM    [address]  [place], [place] [address]                Office [phone]  Fax [phone]

## 2022-06-07 ENCOUNTER — Other Ambulatory Visit: Payer: BC Managed Care – PPO

## 2022-06-10 ENCOUNTER — Encounter: Payer: Self-pay | Admitting: Podiatry

## 2022-06-18 ENCOUNTER — Inpatient Hospital Stay: Admission: RE | Admit: 2022-06-18 | Payer: BC Managed Care – PPO | Source: Ambulatory Visit

## 2022-07-02 ENCOUNTER — Ambulatory Visit
Admission: RE | Admit: 2022-07-02 | Discharge: 2022-07-02 | Disposition: A | Discharge status: Home / Self Care | Payer: BC Managed Care – PPO | Source: Ambulatory Visit | Attending: Podiatry | Admitting: Podiatry

## 2022-07-02 DIAGNOSIS — G8929 Other chronic pain: Secondary | ICD-10-CM

## 2022-07-07 ENCOUNTER — Telehealth: Payer: Self-pay | Admitting: Podiatry

## 2022-07-07 NOTE — Telephone Encounter (Signed)
Pt  is wanting to see if you can give her a call to go over her MRI results.  Please advise

## 2022-07-13 NOTE — Telephone Encounter (Signed)
-----   Message from Felecia Shelling, DPM sent at 07/12/2022  5:48 PM EDT ----- Order was placed for nerve conduction study on 05/17/2022. Patient said she was contacted and they could not get her in... I'm assuming [facility]. Could we get her in somewhere else?  -Dr. Logan Bores

## 2022-07-13 NOTE — Telephone Encounter (Signed)
-----   Message from Brent M Evans, DPM sent at 07/12/2022  5:48 PM EDT ----- Order was placed for nerve conduction study on 05/17/2022. Patient said she was contacted and they could not get her in... I'm assuming [facility]. Could we get her in somewhere else?  -Dr. Evans  

## 2022-07-13 NOTE — Telephone Encounter (Signed)
The order was never received at [facility], faxed the order to them, confirmation received. Patient has been updated and given their number to contact if needed.

## 2022-07-15 ENCOUNTER — Other Ambulatory Visit: Payer: Self-pay

## 2022-07-15 DIAGNOSIS — G5792 Unspecified mononeuropathy of left lower limb: Secondary | ICD-10-CM

## 2022-09-08 ENCOUNTER — Telehealth: Payer: Self-pay | Admitting: *Deleted

## 2022-09-08 NOTE — Telephone Encounter (Signed)
Patient is calling to request a handicap placard , please advise.

## 2022-09-14 NOTE — Telephone Encounter (Signed)
Patient has been contacted to update that the requested handicap placard has been approved, completed. She is requesting that it be mailed to home address.

## 2023-03-16 ENCOUNTER — Telehealth: Payer: Self-pay | Admitting: Podiatry

## 2023-03-16 NOTE — Telephone Encounter (Signed)
 Patient called stating her handicap placard is expiring she would like a renewal on that.

## 2023-08-31 ENCOUNTER — Telehealth: Payer: Self-pay

## 2023-08-31 NOTE — Telephone Encounter (Signed)
 Patient called and left a message - she is asking to renew her handicap placard She was last seen in March 2024, surgery was in 2022

## 2023-09-07 ENCOUNTER — Telehealth: Payer: Self-pay | Admitting: Podiatry

## 2023-09-07 ENCOUNTER — Ambulatory Visit: Admitting: Podiatry

## 2023-09-07 NOTE — Telephone Encounter (Signed)
 Patient expressed frustration regarding the ongoing issue with her handicap placard renewal, which she has been attempting to obtain since 04/05/2023. She stated that she was informed at that time it would be mailed, but she never received it. She delayed following up further because she did not want it to appear that she had misused the placard she never received.  Since then, the patient has continued to struggle with walking long distances or standing for extended periods. She reports surgery preformed not once but twice on her ankle, which has significantly impacted her daily functioning. She has called multiple times since surgery every six months requesting placard renewals.  Although she is willing to schedule an appointment, your first availability is July 28th. In the meantime, she is requesting a phone call at your earliest convenience to further discuss her situation.

## 2023-09-07 NOTE — Telephone Encounter (Signed)
 Called pt and left a voice mail asking pt to call me back.  Pt has not been seen in the office since early 2024 and surgery was in 2022.  An appointment needs to be made before order for handicap placard can be given.

## 2023-09-26 ENCOUNTER — Encounter: Payer: Self-pay | Admitting: Podiatry

## 2023-09-26 ENCOUNTER — Ambulatory Visit: Admitting: Podiatry

## 2023-09-26 DIAGNOSIS — G8929 Other chronic pain: Secondary | ICD-10-CM | POA: Diagnosis not present

## 2023-09-26 DIAGNOSIS — M25572 Pain in left ankle and joints of left foot: Secondary | ICD-10-CM | POA: Diagnosis not present

## 2023-09-26 NOTE — Progress Notes (Unsigned)
   No chief complaint on file.   Subjective:  Patient presents today PSxHx ankle arthroscopy with excision of lipoma lateral aspect of the left ankle.  DOS: 09/18/2020.  Patient developed hematoma around the incision site and underwent subsequent drainage with irrigation left ankle. DOS: 10/23/2020.   Patient states that she continues to have chronic severe pain to her left foot and ankle.  She was last seen here in the office 05/17/2018 for over 1-year ago.  Continued chronic ankle pain with activity.  She is requesting a handicap parking placard today  No past medical history on file.   No past surgical history on file. Allergies  Allergen Reactions   Blue Dyes (Parenteral)     Other reaction(s): Other vomiting   Brendolyn Stockley Blue Other (See Comments)    vomiting vomiting    Neomycin Swelling and Other (See Comments)    Unknown   Metformin Other (See Comments)   Hydrochlorothiazide Palpitations   Iodinated Contrast Media Nausea And Vomiting and Other (See Comments)    Other reaction(s): Vomiting (intolerance)   Vancomycin Rash    Objective: Physical Exam General: The patient is alert and oriented x3 in no acute distress.  Dermatology: Skin is cool, dry and supple bilateral lower extremities.  The incision sites have healed nicely.  No hypertrophic scar or keloid formation.  Vascular: Palpable pedal pulses bilaterally. No edema or erythema noted. Capillary refill within normal limits.  Neurological: Light touch and protective threshold grossly intact bilaterally.  Paresthesia with touch along the lateral aspect of the ankle extending to the foot.  Musculoskeletal Exam: Unchanged.  Stable.  Range of motion within normal limits.  There is pain on palpation throughout the left ankle.  The patient does have decreased dorsiflexion of her toes against resistance compared to the contralateral limb.  Radiographic exam LT ankle 05/17/2022: Mostly unchanged compared to prior x-rays.  No acute  fractures identified.  Diffuse osseous demineralization consistent with possible osteopenia of the bones.  Joint spaces are mostly preserved.  No periarticular spurring noted.   Assessment: 1. s/p excision lipoma with ankle arthroscopy LT ankle. DOS: 09/18/2020 2.  Subsequent incision and drainage LT ankle hematoma. DOS: 10/23/2020 2.  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  Chronic severe ankle pain left 4.  Loss of dorsiflexion of the toes left  -Patient evaluated.  - Requesting handicap parking placard today.  I informed her that we only provide temporary parking placard forms which we will provide for the patient today x 6 months.  Renewals as needed -I do believe next steps for the patient would be updated x-rays and MRI however the patient ultimately declined.  She is not interested in any surgical intervention or additional conservative treatment options for the moment -Continue wearing good supportive tennis shoes and sneakers -NSAIDs as needed -Return to clinic as needed  Thresa EMERSON Sar, DPM [facility]  Dr. Thresa EMERSON Sar, DPM    [address] [place], [place] [address]                Office [phone]  Fax [phone]

## 2023-09-30 ENCOUNTER — Telehealth: Payer: Self-pay | Admitting: Podiatry

## 2023-09-30 ENCOUNTER — Other Ambulatory Visit: Payer: Self-pay | Admitting: Podiatry

## 2023-09-30 MED ORDER — MELOXICAM 15 MG PO TABS: 15.0000 mg | ORAL_TABLET | Freq: Every day | ORAL | 1 refills | Status: AC

## 2023-09-30 NOTE — Telephone Encounter (Signed)
 Patient called in regards to a prescription. She said that a pain medication was mentioned during the visit, but is unsure of the name.

## 2023-10-03 ENCOUNTER — Ambulatory Visit: Admitting: Podiatry

## 2023-10-05 NOTE — Telephone Encounter (Signed)
 pt lft mess on my vmail and I called her back. She needs updated note. She will email me and I will revised on our letterhead and send back to her. Her employer doesn't think it was legit???

## 2023-10-06 ENCOUNTER — Encounter: Payer: Self-pay | Admitting: Podiatry

## 2023-10-06 NOTE — Telephone Encounter (Signed)
 Emailed pt updated note for her employer. See prev notes
